# Patient Record
Sex: Male | Born: 1950 | Race: White | Hispanic: No | Marital: Married | Smoking: Never smoker
Health system: Southern US, Community
[De-identification: ages and names within clinical notes are randomized; demographics above are authoritative.]

## PROBLEM LIST (undated history)

## (undated) DIAGNOSIS — J45909 Unspecified asthma, uncomplicated: Secondary | ICD-10-CM

## (undated) DIAGNOSIS — J189 Pneumonia, unspecified organism: Secondary | ICD-10-CM

## (undated) DIAGNOSIS — Z87442 Personal history of urinary calculi: Secondary | ICD-10-CM

## (undated) DIAGNOSIS — M1990 Unspecified osteoarthritis, unspecified site: Secondary | ICD-10-CM

## (undated) DIAGNOSIS — I10 Essential (primary) hypertension: Secondary | ICD-10-CM

## (undated) HISTORY — PX: BACK SURGERY: SHX140

## (undated) HISTORY — PX: CARPAL TUNNEL RELEASE: SHX101

## (undated) HISTORY — PX: SINUS SURGERY WITH INSTATRAK: SHX5215

## (undated) HISTORY — PX: JOINT REPLACEMENT: SHX530

## (undated) HISTORY — PX: COLONOSCOPY: SHX5424

## (undated) HISTORY — PX: SHOULDER ARTHROSCOPY: SHX128

---

## 2010-06-07 ENCOUNTER — Encounter (HOSPITAL_COMMUNITY)
Admission: RE | Admit: 2010-06-07 | Discharge: 2010-06-07 | Disposition: A | Discharge status: Home / Self Care | Payer: Managed Care, Other (non HMO) | Source: Ambulatory Visit | Attending: Orthopedic Surgery | Admitting: Orthopedic Surgery

## 2010-06-07 DIAGNOSIS — Z01812 Encounter for preprocedural laboratory examination: Secondary | ICD-10-CM | POA: Insufficient documentation

## 2010-06-07 DIAGNOSIS — M19019 Primary osteoarthritis, unspecified shoulder: Secondary | ICD-10-CM | POA: Insufficient documentation

## 2010-06-07 DIAGNOSIS — Z01818 Encounter for other preprocedural examination: Secondary | ICD-10-CM | POA: Insufficient documentation

## 2010-06-07 LAB — CBC
MCV: 91.2 fL (ref 78.0–100.0)
Platelets: 218 10*3/uL (ref 150–400)
RBC: 4.77 MIL/uL (ref 4.22–5.81)
WBC: 7.4 10*3/uL (ref 4.0–10.5)

## 2010-06-07 LAB — COMPREHENSIVE METABOLIC PANEL
Albumin: 4.5 g/dL (ref 3.5–5.2)
BUN: 14 mg/dL (ref 6–23)
Calcium: 9.6 mg/dL (ref 8.4–10.5)
Creatinine, Ser: 0.92 mg/dL (ref 0.4–1.5)
Glucose, Bld: 77 mg/dL (ref 70–99)
Total Protein: 7 g/dL (ref 6.0–8.3)

## 2010-06-07 LAB — PROTIME-INR
INR: 0.93 (ref 0.00–1.49)
Prothrombin Time: 12.7 seconds (ref 11.6–15.2)

## 2010-06-07 LAB — DIFFERENTIAL
Basophils Absolute: 0 10*3/uL (ref 0.0–0.1)
Eosinophils Absolute: 0.2 10*3/uL (ref 0.0–0.7)
Lymphs Abs: 2.4 10*3/uL (ref 0.7–4.0)
Neutrophils Relative %: 54 % (ref 43–77)

## 2010-06-07 LAB — URINALYSIS, ROUTINE W REFLEX MICROSCOPIC
Bilirubin Urine: NEGATIVE
Nitrite: NEGATIVE
Specific Gravity, Urine: 1.028 (ref 1.005–1.030)
Urobilinogen, UA: 0.2 mg/dL (ref 0.0–1.0)
pH: 5.5 (ref 5.0–8.0)

## 2010-06-07 LAB — SURGICAL PCR SCREEN
MRSA, PCR: NEGATIVE
Staphylococcus aureus: NEGATIVE

## 2010-06-07 LAB — APTT: aPTT: 27 seconds (ref 24–37)

## 2010-06-13 ENCOUNTER — Observation Stay (HOSPITAL_COMMUNITY)
Admission: RE | Admit: 2010-06-13 | Discharge: 2010-06-14 | Disposition: A | Discharge status: Home / Self Care | Payer: Managed Care, Other (non HMO) | Source: Ambulatory Visit | Attending: Orthopedic Surgery | Admitting: Orthopedic Surgery

## 2010-06-13 DIAGNOSIS — J45909 Unspecified asthma, uncomplicated: Secondary | ICD-10-CM | POA: Insufficient documentation

## 2010-06-13 DIAGNOSIS — M19019 Primary osteoarthritis, unspecified shoulder: Principal | ICD-10-CM | POA: Insufficient documentation

## 2010-07-08 NOTE — Op Note (Signed)
Glenn Mcdonald, Glenn Mcdonald              ACCOUNT NO.:  [id number]  MEDICAL RECORD NO.:  [id number]           PATIENT TYPE:  I  LOCATION:  5002                         FACILITY:  [facility]  PHYSICIAN:  Glenn Mcdonald, M.D.  DATE OF BIRTH:  04-09-1950  DATE OF PROCEDURE:  06/13/2010 DATE OF DISCHARGE:                              OPERATIVE REPORT   PREOPERATIVE DIAGNOSIS:  End-stage right shoulder glenohumeral joint osteoarthrosis.  POSTOPERATIVE DIAGNOSIS:  End-stage right shoulder glenohumeral joint osteoarthrosis.  PROCEDURE:  Right total shoulder arthroplasty utilizing a size 12 Press- Fit DePuy global stem with a 52 x 18 humeral head, and a cemented pegged 48 glenoid.  SURGEON:  Glenn Rea. Aidden Markovic, MD  ASSISTANT:  Glenn Lora. Shuford, PA-C.  ANESTHESIA:  General endotracheal as well as an interscalene block.  ESTIMATED BLOOD LOSS:  100 mL.  DRAINS:  None.  HISTORY:  Glenn Mcdonald is a 60 year old gentleman who has had progressive increasing bilateral shoulder pain, right more severe than left with radiographs showing end-stage arthrosis right more severe than left with bone-on-bone contact, and large peripheral osteophyte formation.  Due to his ongoing pain and functional limitations, he is brought to the operating room at this time for planned right total shoulder arthroplasty.  We preoperatively counseled Glenn Mcdonald on treatment options as well as risks versus benefits thereof.  Possible surgical complications were reviewed including the potential for bleeding, infection, neurovascular injury, persistent pain, failure of the implant, anesthetic complication and possible need for additional surgery.  He understands and accepts and agrees with our planned procedure.  PROCEDURE IN DETAIL:  After undergoing routine preop evaluation, the patient received prophylactic antibiotics, and an interscalene block was established in the holding area by the Anesthesia Department.   Placed supine on the operating table and underwent smooth induction of general endotracheal anesthesia.  Placed into beach-chair position and appropriately padded and protected.  Right shoulder examination showed marked restrictions in mobility with severe crepitance.  Right shoulder girdle region was then sterilely prepped and draped standard fashion. Time-out was called.  An anterior approach to the right shoulder was made through the deltopectoral interval through an approximately 15-cm incision.  Skin flaps were elevated.  Electrocautery was used for hemostasis.  The cephalic vein was identified, retracted laterally with the deltoid, and the interval was then developed bluntly.  Upper margin of this pectoralis major tendon was tenotomized to improve visualization.  Self-retaining retractors were placed, and the adhesions in the subacromial/subdeltoid bursa were released.  The conjoined joint tendon was mobilized and retracted medially.  The anterior humeral circumflex vessels were identified and electrocoagulated.  Biceps tendon sheath was unroofed, biceps tendon was tenotomized for later tenodesis. The subscapularis was then divided from the lesser tuberosity leaving a 1-cm cuff of tissue for later repair.  The free margin of the subscap was then tagged with a series of grasping #2 FiberWire sutures.  The rotator interval was then split to the base of the coracoid, and subscap was retracted medially.  Humeral head was then delivered through the wound and externally rotated as the capsular attachments were released from the anterior, inferior, and posterior  margins of the humeral head allowing complete dislocation of the humeral head, and protected the rotator cuff superiorly and posteriorly.  Very prominent osteophyte was noted inferiorly.  Using an extramedullary guide, we outlined our proposed humeral head resection, and this was then performed with approximately 30 degrees  retroversion using an oscillating saw.  We then removed the peripheral osteophytes.  Capsular releases were was then completed.  With care taken to protect the rotator cuff, we the performed hand reaming opening up the humeral canal to a size 12.  The box cutting guide was then placed and had 30 degrees retroversion, it was driven into place.  We then performed broaching up to size 12 with excellent fit.  The broach was removed and metal cap placed on the cut surface of the proximal humerus.  We then turned our attention to the glenoid, which was exposed with a combination of pitchfork and snake tongue retractors as well as the [place].  Performed a circumferential labral resection and capsular release.  Once we had complete exposure of the glenoid, we then sized and the 48 showed the best coverage and fit. The central guidepin was placed and we reamed with a size 48 reamer cracking a bit of the retroversion of the glenoid.  The central peg hole was then drilled and then using proper guide, the peripheral peg holes were then drilled.  We then performed a trial, which showed excellent fit.  At this point, the glenoid was then irrigated, dried, cement wasmixed, introduced into the peripheral peg holes, and then final 48 glenoid was then impacted into position showing excellent fit.  Once the cement had hardened, we then turned our attention back to the proximal humerus where the final size 12 stem was impacted into position.  We did use bone graft harvested from the resected humeral head to bone graft about the proximal metaphyseal portion of the implant and again, excellent fixation there was achieved.  We then performed trial reductions and the 52 x 18 head showed the best coverage with excellent soft tissue balance and approximately 50% translation of the humeral head posteriorly.  We performed final impaction of the 52 x 18 head and a final reduction was performed.  Overall, position  alignment was much to my satisfaction.  The subscapularis was then repaired to the lesser tuberosity with the #2 FiberWire sutures, and then rotator interval was closed in addition with 2 cm using the figure-of-eight #2 FiberWire sutures.  Shoulder was taken through a range of motion showing excellent soft tissue balance, good mobility, and good stability.  Final irrigation was completed.  Hemostasis was obtained.  The deltopectoral interval was then reapproximated with 0 Vicryl with a tag of #2 FiberWire at the midpoint.  2-0 Vicryl was used for subcu layer and intracuticular 3-0 Monocryl for the skin followed by Steri-Strips.  Dry dressing was then applied, and the right arm placed in a sling immobilizer.  The patient was awakened, extubated, and taken to the recovery room in stable condition.     Glenn Rea. Mendell Bontempo, M.D.     KMS/MEDQ  D:  06/13/2010  T:  06/14/2010  Job:  [id number]  Electronically Signed by Francena Hanly M.D. on 07/08/2010 12:52:34 PM

## 2010-12-02 ENCOUNTER — Encounter (HOSPITAL_COMMUNITY)
Admission: RE | Admit: 2010-12-02 | Discharge: 2010-12-02 | Disposition: A | Discharge status: Home / Self Care | Payer: Managed Care, Other (non HMO) | Source: Ambulatory Visit | Attending: Orthopedic Surgery | Admitting: Orthopedic Surgery

## 2010-12-02 LAB — URINALYSIS, ROUTINE W REFLEX MICROSCOPIC
Bilirubin Urine: NEGATIVE
Leukocytes, UA: NEGATIVE
Nitrite: NEGATIVE
Specific Gravity, Urine: 1.019 (ref 1.005–1.030)
Urobilinogen, UA: 0.2 mg/dL (ref 0.0–1.0)
pH: 5 (ref 5.0–8.0)

## 2010-12-02 LAB — COMPREHENSIVE METABOLIC PANEL
ALT: 31 U/L (ref 0–53)
AST: 21 U/L (ref 0–37)
Alkaline Phosphatase: 75 U/L (ref 39–117)
CO2: 31 mEq/L (ref 19–32)
Calcium: 10 mg/dL (ref 8.4–10.5)
Glucose, Bld: 79 mg/dL (ref 70–99)
Potassium: 5.1 mEq/L (ref 3.5–5.1)
Sodium: 140 mEq/L (ref 135–145)
Total Protein: 7.5 g/dL (ref 6.0–8.3)

## 2010-12-02 LAB — SURGICAL PCR SCREEN
MRSA, PCR: NEGATIVE
Staphylococcus aureus: NEGATIVE

## 2010-12-02 LAB — CBC
Hemoglobin: 15.6 g/dL (ref 13.0–17.0)
MCH: 32 pg (ref 26.0–34.0)
MCHC: 35.1 g/dL (ref 30.0–36.0)
Platelets: 202 10*3/uL (ref 150–400)
RBC: 4.88 MIL/uL (ref 4.22–5.81)

## 2010-12-02 LAB — APTT: aPTT: 28 seconds (ref 24–37)

## 2010-12-12 ENCOUNTER — Ambulatory Visit (HOSPITAL_COMMUNITY)
Admission: RE | Admit: 2010-12-12 | Discharge: 2010-12-13 | DRG: 484 | Disposition: A | Discharge status: Home / Self Care | Payer: Managed Care, Other (non HMO) | Source: Ambulatory Visit | Attending: Orthopedic Surgery | Admitting: Orthopedic Surgery

## 2010-12-12 DIAGNOSIS — Z01812 Encounter for preprocedural laboratory examination: Secondary | ICD-10-CM | POA: Insufficient documentation

## 2010-12-12 DIAGNOSIS — J45909 Unspecified asthma, uncomplicated: Secondary | ICD-10-CM | POA: Insufficient documentation

## 2010-12-12 DIAGNOSIS — M19019 Primary osteoarthritis, unspecified shoulder: Secondary | ICD-10-CM | POA: Insufficient documentation

## 2011-01-14 NOTE — Op Note (Signed)
NAMEAEDYN, Glenn Mcdonald              ACCOUNT NO.:  [id number]  MEDICAL RECORD NO.:  [id number]  LOCATION:  [facility]                         FACILITY:  [facility]  PHYSICIAN:  Vania Rea. Jacqueline Delapena, M.D.  DATE OF BIRTH:  13-Apr-1950  DATE OF PROCEDURE:  12/12/2010 DATE OF DISCHARGE:                              OPERATIVE REPORT   PREOPERATIVE DIAGNOSIS:  End-stage left shoulder osteoarthrosis.  POSTOPERATIVE DIAGNOSIS:  End-stage left shoulder osteoarthrosis.  PROCEDURE:  Left total shoulder arthroplasty utilizing a Press-Fit size 10 DePuy global stem with a 52 x 18 eccentric humeral head and a cemented pegged 48 glenoid.  SURGEON:  Vania Rea. Harding Thomure, MD  ASSISTANT:  Lucita Lora. Shuford, PA-C  ANESTHESIA:  General endotracheal as well as an interscalene block.  BLOOD LOSS:  250 mL.  DRAINS:  None.  HISTORY:  Glenn Mcdonald is a 60 year old gentleman who has had chronic and progressive increasing left shoulder pain as well as functional limitations secondary to end-stage osteoarthrosis.  I had performed a right total shoulder arthroplasty previously which he has done very well with and now returns for planned left total shoulder arthroplasty.  Preoperatively, we counseled Glenn Mcdonald on treatment options as well as risks versus benefits thereof.  Possible surgical complications were reviewed including potential for bleeding, infection, neurovascular injury, persistent pain, loss of motion, failure of implant, anesthetic complication, and possible need for additional surgery.  They understand and accept and agree with our planned procedure.  PROCEDURE IN DETAIL:  After undergoing routine preop evaluation, the patient received prophylactic antibiotics and an interscalene block was established in holding area by the Anesthesia Department.  He was placed supine on the operating table and underwent smooth induction of general endotracheal anesthesia.  He was placed into beach-chair position  and appropriately padded and protected.  Left shoulder girdle region was sterilely prepped and draped in a standard fashion.  A time-out was called.  An anterior approach to the left shoulder was made through an approximately 15-cm incision along the deltopectoral interval.  Skin flaps were elevated and electrocautery was used for hemostasis.  The cephalic vein was identified and retracted laterally with the deltoid. The deltopectoral interval was then developed bluntly and the upper centimeter of the pec major was tenotomized for enhance visualization. The conjoined tendon was then identified, mobilized, and retracted medially.  We then identified the biceps tendon, unroofed bicipital groove, and tenotomized the biceps.  We then split the rotator interval towards the base of the coracoid and then tenotomized the subscapularis leaving a cuff approximately 2 cm in length of tissue at the lesser tuberosity for later repair and then tagged the free margin of the subscapularis with a series of #2 FiberWire sutures.  The subscapularis was then circumferentially mobilized and retracted medially.  This allowed [place] to deliver the humeral head through the wound.  Using an extramedullary guide, we then outlined our proposed humeral head resection and this was performed with an oscillating saw at approximately 30-degree retroversion angle.  We then removed the peripheral osteophytes, which were very large anteriorly.  A series of hand reaming was then used to gain access to the humoral medullary canal and we reamed up to size 10  which had a very tight fit distally.  We then used the cookie cutter broach to establish the position of the implant at approximately 30 degrees of retroversion.  We broached him and we then performed up to size 10 with good fit.  Once this was completed, we used a metal cap to protect the cut surface of the proximal humerus and then used retractors to expose the glenoid  with [place] posteriorly pitchfork and snake tongue anteriorly and superiorly and inferiorly as necessary.  We then performed a circumferential release of the labrum and capsular tissues and it should also mention that we divided capsular tissues away from the anterior, inferior, and posterior aspects of the humeral head to enhance visualization.  Once we had completely removed all peripheral labral tissue from the glenoid, it was felt that there was a mild degree of retroversion.  The 48 glenoid had the best fit and the central guidepin was then placed and we did correct for the retroversion and reamed with a 48 to a subchondral bony bed.  All residual bony debris was removed with rongeur.  Central drill hole was then placed and then using the peripheral drill guide we drilled the peripheral peg holes.  The trial 48 showed excellent fit. At this point, the glenoid was then irrigated.  Cement was mixed in the appropriate consistency.  We introduced cement into the peripheral peg holes and the final 48 glenoid was tamped into position with excellent fixation and fit.  Cement was allowed to harden.  At this point, we then returned our attention to the proximal humerus where we irrigated the canal and then implanted the size 10 stem after we utilized bone graft harvested from the resected humeral head to pack around the metaphyseal segment of the stem and then performed final implantation, obtained excellent fixation and fit.  We then performed trial reductions and the 52 x 18 eccentric provided the best coverage of the proximal humerus and good soft tissue balance with 50% translation of the humeral head on the glenoid.  The final 58 x 18 eccentric was then placed into final position, impacted after the [facility] taper was meticulously cleaned. Final reduction was performed.  At this point, we then repaired the subscapularis to the soft tissue tag utilizing a series of #2 FiberWire sutures,  obtaining excellent fixation fit and good soft tissue balance and we again confirmed the subscapularis had been properly mobilized and once this was completed we easily achieved 30 degrees of external rotation.  We also closed the lateral 2 cm of the rotator interval with figure-of-eight #2 FiberWire sutures.  A biceps tenodesis was then performed also utilizing the fiber wires.  The wound was then copiously irrigated.  Hemostasis was obtained.  The deltopectoral interval was then reapproximated with a series of 0 Vicryl sutures.  2-0 Vicryl was used for the subcu layer and intracuticular 3-0 Monocryl for the skin followed by Steri-Strips.  Dry dressing was then applied and the left arm was placed in a sling.  The patient was awakened, extubated, and taken to the recovery room in stable condition.     Vania Rea. Osa Fogarty, M.D.     KMS/MEDQ  D:  12/12/2010  T:  12/12/2010  Job:  [id number]  Electronically Signed by Francena Hanly M.D. on 01/14/2011 06:43:33 PM

## 2011-01-14 NOTE — Op Note (Signed)
  NAMETRAYSHAWN, DURKIN              ACCOUNT NO.:  [id number]  MEDICAL RECORD NO.:  [id number]  LOCATION:  [facility]                         FACILITY:  [facility]  PHYSICIAN:  Vania Rea. Eustacia Urbanek, M.D.  DATE OF BIRTH:  1950-08-08  DATE OF PROCEDURE:  12/12/2010 DATE OF DISCHARGE:                              OPERATIVE REPORT   ADDENDUM:  Ralene Bathe, PA-C was a surgical assistant throughout this case essential for positioning of the extremity, holding retractors, exposure, and intraoperative clinical decision making.     Vania Rea. Yarelin Reichardt, M.D.     KMS/MEDQ  D:  12/12/2010  T:  12/12/2010  Job:  [id number]  Electronically Signed by Francena Hanly M.D. on 01/14/2011 06:43:35 PM

## 2017-08-17 ENCOUNTER — Other Ambulatory Visit: Payer: Self-pay | Admitting: Specialist

## 2017-08-17 DIAGNOSIS — M5442 Lumbago with sciatica, left side: Secondary | ICD-10-CM

## 2017-08-24 ENCOUNTER — Ambulatory Visit
Admission: RE | Admit: 2017-08-24 | Discharge: 2017-08-24 | Disposition: A | Discharge status: Home / Self Care | Payer: Self-pay | Source: Ambulatory Visit | Attending: Specialist | Admitting: Specialist

## 2017-08-24 ENCOUNTER — Other Ambulatory Visit: Payer: Self-pay | Admitting: Specialist

## 2017-08-24 DIAGNOSIS — M5442 Lumbago with sciatica, left side: Secondary | ICD-10-CM

## 2017-08-25 ENCOUNTER — Ambulatory Visit
Admission: RE | Admit: 2017-08-25 | Discharge: 2017-08-25 | Disposition: A | Discharge status: Home / Self Care | Payer: Medicare Other | Source: Ambulatory Visit | Attending: Specialist | Admitting: Specialist

## 2017-08-25 DIAGNOSIS — M5442 Lumbago with sciatica, left side: Secondary | ICD-10-CM

## 2017-08-25 MED ORDER — ONDANSETRON HCL 4 MG/2ML IJ SOLN: 4.0000 mg | Freq: Four times a day (QID) | INTRAMUSCULAR | Status: DC | PRN

## 2017-08-25 MED ORDER — DIAZEPAM 5 MG PO TABS
5.0000 mg | ORAL_TABLET | Freq: Once | ORAL | Status: AC
  Administered 2017-08-25: 5 mg via ORAL

## 2017-08-25 MED ORDER — IOPAMIDOL (ISOVUE-M 200) INJECTION 41%
15.0000 mL | Freq: Once | INTRAMUSCULAR | Status: AC
  Administered 2017-08-25: 15 mL via INTRATHECAL

## 2017-08-25 NOTE — Discharge Instructions (Signed)

## 2017-09-03 ENCOUNTER — Ambulatory Visit: Payer: Self-pay | Admitting: Orthopedic Surgery

## 2017-09-08 ENCOUNTER — Ambulatory Visit: Payer: Self-pay | Admitting: Orthopedic Surgery

## 2017-09-08 NOTE — H&P (Signed)
Glenn Mcdonald is an 67 y.o. male.   Chief Complaint: back and L leg pain HPI: Reason for Visit: review of test results (CT/Myelogram); The patient is 7 1/2 months out from when symptoms began..  Location (Lower Extremity): lower back pain on the left; leg pain on the left, foot pain on the left, Severity: pain level 2/10  Aggravating Factors: standing for 10 min  Associated Symptoms: numbness/tingling; weakness (left lower extremity)  Medications: The patient is not taking any medication for his back, but is taking Hydrocodone for a kidney stone.  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 Codeine Nausea Only   Medications: amLODIPine 5 mg tablet aspirin ciprofloxacin 500 mg tablet DOK 100 mg capsule TK 2 CS PO QD HYDROcodone 7.5 mg-acetaminophen 325 mg tabl rosuvastatin 20 mg tablet tamsulosin 0.4 mg capsule  Review of Systems  Constitutional: Negative.   HENT: Negative.   Eyes: Negative.   Respiratory: Negative.   Cardiovascular: Negative.   Gastrointestinal: Negative.   Genitourinary: Negative.   Musculoskeletal: Positive for back pain and joint pain.  Skin: Negative.   Neurological: Positive for sensory change and focal weakness.  Psychiatric/Behavioral: Negative.     There were no vitals taken for this visit. Physical Exam  Constitutional: He is oriented to person, place, and time. He appears well-developed and well-nourished. He appears distressed.  HENT:  Head: Normocephalic.  Eyes: Pupils are equal, round, and reactive to light.  Neck: Normal range of motion.   Cardiovascular: Normal rate.  Respiratory: Effort normal.  GI: Soft.  Musculoskeletal:  Gait and Station: Appearance: ambulating with no assistive devices and antalgic gait.  Constitutional: General Appearance: healthy-appearing and distress (mild).  Psychiatric: Mood and Affect: active and alert.  Cardiovascular System: Edema Right: none; Dorsalis and posterior tibial pulses 2+. Edema Left: none.  Abdomen: Inspection and Palpation: non-distended and no tenderness.  Skin: Inspection and palpation: no rash.  Lumbar Spine: Inspection: normal alignment. Bony Palpation of the Lumbar Spine: tender at lumbosacral junction.. Bony Palpation of the Right Hip: no tenderness of the greater trochanter and tenderness of the SI joint; Pelvis stable. Bony Palpation of the Left Hip: no tenderness of the greater trochanter and tenderness of the SI joint. Soft Tissue Palpation on the Right: No flank pain with percussion. Active Range of Motion: limited flexion and extention.  Motor Strength: L1 Motor Strength on the Right: hip flexion iliopsoas 5/5. L1 Motor Strength on the Left: hip flexion iliopsoas 5/5. L2-L4 Motor Strength on the Right: knee extension quadriceps 5/5. L2-L4 Motor Strength on the Left: knee extension quadriceps 5/5. L5 Motor Strength on the Right: ankle dorsiflexion tibialis anterior 5/5 and great toe extension extensor hallucis longus 5/5. L5 Motor Strength on the Left: ankle dorsiflexion tibialis anterior 5/5 and great toe extension extensor hallucis longus 5/5. S1 Motor Strength on the Right: plantar flexion gastrocnemius 5/5. S1 Motor Strength on the Left: plantar flexion gastrocnemius 5/5.  Neurological System: Knee Reflex Right: normal (2). Knee Reflex Left: normal (2). Ankle Reflex Right: normal (2). Ankle Reflex Left: normal (2). Babinski Reflex Right: plantar reflex absent. Babinski Reflex Left: plantar reflex absent. Sensation on the Right: normal distal extremities. Sensation   on the  Left: normal distal extremities. Special Tests on the Right: no clonus of the ankle/knee and seated straight leg raising test positive. Special Tests on the Left: no clonus of the ankle/knee and seated straight leg raising test positive.  Straight leg raise a left buttock pain calf pain negative on the right EHLs 4+/5 left compared to the right. Altered sensation L5-S1 dermatome.  Neurological: He is alert and oriented to person, place, and time.  Skin: Skin is warm and dry.     Assessment/Plan CT myelogram demonstrates of stenosis at the L4-5 and L5-S1 spaces. History of lumbar decompression L5-S1. MRI demonstrates disc bulge and extrusion with L5 nerve root compression at L4-5. Lateral recess stenosis at L5-S1. This is the second to the last open disc space.  Patient demonstrates refractory L5-S1 radiculopathy. History of lumbar decompression at 5 1 apparently. Patient has a transitional vertebrae.  We discussed options including living with his symptoms injections pain management versus consideration of microlumbar decompression. He would like to proceed with the decompression.  I recommend a central decompression with removal of the spinous process of 5 and the neural arch of 5 to decompression L4-5 and L5-S1.  I had an extensive discussion with the patient concerning the pathology relevant anatomy and treatment options. At this point exhausting conservative treatment and in the presence of a neurologic deficit we discussed microlumbar decompression. I discussed the risks and benefits including bleeding, infection, DVT, PE, anesthetic complications, worsening in their symptoms, improvement in their symptoms, C SF leakage, epidural fibrosis, need for future surgeries such as revision discectomy and lumbar fusion. I also indicated that this is an operation to basically decompress the nerve root to allow recovery as opposed to fixing a herniated disc and that the incidence of recurrent chest disc  herniation can approach 15%. Also that nerve root recovery is variable and may not recover completely.  I discussed the operative course including overnight in the hospital. Immediate ambulation. Follow-up in 2 weeks for suture removal. 6 weeks until healing of the herniation followed by 6 weeks of reconditioning and strengthening of the core musculature. Also discussed the need to employ the concepts of disc pressure management and core motion following the surgery to minimize the risk of recurrent disc herniation. We will obtain preoperative clearance i if necessary and proceed accordingly.  Also discussed possibility requiring fusion in the future progressive distant degeneration I am making no surgery in the future possible. And with a revision a higher degree of CSF leakage.  Plan revision microlumbar decompression L4-5, L5-S1   Donathan Buller M., PA-C  For Dr. Beane 09/08/2017, 8:50 AM   

## 2017-09-08 NOTE — H&P (View-Only) (Signed)
Glenn Mcdonald is an 67 y.o. male.   Chief Complaint: back and L leg pain HPI: Reason for Visit: review of test results (CT/Myelogram); The patient is 7 1/2 months out from when symptoms began..  Location (Lower Extremity): lower back pain on the left; leg pain on the left, foot pain on the left, Severity: pain level 2/10  Aggravating Factors: standing for 10 min  Associated Symptoms: numbness/tingling; weakness (left lower extremity)  Medications: The patient is not taking any medication for his back, but is taking Hydrocodone for a kidney stone.  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 Codeine Nausea Only   Medications: amLODIPine 5 mg tablet aspirin ciprofloxacin 500 mg tablet DOK 100 mg capsule TK 2 CS PO QD HYDROcodone 7.5 mg-acetaminophen 325 mg tabl rosuvastatin 20 mg tablet tamsulosin 0.4 mg capsule  Review of Systems  Constitutional: Negative.   HENT: Negative.   Eyes: Negative.   Respiratory: Negative.   Cardiovascular: Negative.   Gastrointestinal: Negative.   Genitourinary: Negative.   Musculoskeletal: Positive for back pain and joint pain.  Skin: Negative.   Neurological: Positive for sensory change and focal weakness.  Psychiatric/Behavioral: Negative.     There were no vitals taken for this visit. Physical Exam  Constitutional: He is oriented to person, place, and time. He appears well-developed and well-nourished. He appears distressed.  HENT:  Head: Normocephalic.  Eyes: Pupils are equal, round, and reactive to light.  Neck: Normal range of motion.   Cardiovascular: Normal rate.  Respiratory: Effort normal.  GI: Soft.  Musculoskeletal:  Gait and Station: Appearance: ambulating with no assistive devices and antalgic gait.  Constitutional: General Appearance: healthy-appearing and distress (mild).  Psychiatric: Mood and Affect: active and alert.  Cardiovascular System: Edema Right: none; Dorsalis and posterior tibial pulses 2+. Edema Left: none.  Abdomen: Inspection and Palpation: non-distended and no tenderness.  Skin: Inspection and palpation: no rash.  Lumbar Spine: Inspection: normal alignment. Bony Palpation of the Lumbar Spine: tender at lumbosacral junction.. Bony Palpation of the Right Hip: no tenderness of the greater trochanter and tenderness of the SI joint; Pelvis stable. Bony Palpation of the Left Hip: no tenderness of the greater trochanter and tenderness of the SI joint. Soft Tissue Palpation on the Right: No flank pain with percussion. Active Range of Motion: limited flexion and extention.  Motor Strength: L1 Motor Strength on the Right: hip flexion iliopsoas 5/5. L1 Motor Strength on the Left: hip flexion iliopsoas 5/5. L2-L4 Motor Strength on the Right: knee extension quadriceps 5/5. L2-L4 Motor Strength on the Left: knee extension quadriceps 5/5. L5 Motor Strength on the Right: ankle dorsiflexion tibialis anterior 5/5 and great toe extension extensor hallucis longus 5/5. L5 Motor Strength on the Left: ankle dorsiflexion tibialis anterior 5/5 and great toe extension extensor hallucis longus 5/5. S1 Motor Strength on the Right: plantar flexion gastrocnemius 5/5. S1 Motor Strength on the Left: plantar flexion gastrocnemius 5/5.  Neurological System: Knee Reflex Right: normal (2). Knee Reflex Left: normal (2). Ankle Reflex Right: normal (2). Ankle Reflex Left: normal (2). Babinski Reflex Right: plantar reflex absent. Babinski Reflex Left: plantar reflex absent. Sensation on the Right: normal distal extremities. Sensation  on the  Left: normal distal extremities. Special Tests on the Right: no clonus of the ankle/knee and seated straight leg raising test positive. Special Tests on the Left: no clonus of the ankle/knee and seated straight leg raising test positive.  Straight leg raise a left buttock pain calf pain negative on the right EHLs 4+/5 left compared to the right. Altered sensation L5-S1 dermatome.  Neurological: He is alert and oriented to person, place, and time.  Skin: Skin is warm and dry.     Assessment/Plan CT myelogram demonstrates of stenosis at the L4-5 and L5-S1 spaces. History of lumbar decompression L5-S1. MRI demonstrates disc bulge and extrusion with L5 nerve root compression at L4-5. Lateral recess stenosis at L5-S1. This is the second to the last open disc space.  Patient demonstrates refractory L5-S1 radiculopathy. History of lumbar decompression at 5 1 apparently. Patient has a transitional vertebrae.  We discussed options including living with his symptoms injections pain management versus consideration of microlumbar decompression. He would like to proceed with the decompression.  I recommend a central decompression with removal of the spinous process of 5 and the neural arch of 5 to decompression L4-5 and L5-S1.  I had an extensive discussion with the patient concerning the pathology relevant anatomy and treatment options. At this point exhausting conservative treatment and in the presence of a neurologic deficit we discussed microlumbar decompression. I discussed the risks and benefits including bleeding, infection, DVT, PE, anesthetic complications, worsening in their symptoms, improvement in their symptoms, C SF leakage, epidural fibrosis, need for future surgeries such as revision discectomy and lumbar fusion. I also indicated that this is an operation to basically decompress the nerve root to allow recovery as opposed to fixing a herniated disc and that the incidence of recurrent chest disc  herniation can approach 15%. Also that nerve root recovery is variable and may not recover completely.  I discussed the operative course including overnight in the hospital. Immediate ambulation. Follow-up in 2 weeks for suture removal. 6 weeks until healing of the herniation followed by 6 weeks of reconditioning and strengthening of the core musculature. Also discussed the need to employ the concepts of disc pressure management and core motion following the surgery to minimize the risk of recurrent disc herniation. We will obtain preoperative clearance i if necessary and proceed accordingly.  Also discussed possibility requiring fusion in the future progressive distant degeneration I am making no surgery in the future possible. And with a revision a higher degree of CSF leakage.  Plan revision microlumbar decompression L4-5, L5-S1   BISSELL, Dayna Barker., PA-C  For Dr. Shelle Iron 09/08/2017, 8:50 AM

## 2017-09-11 NOTE — Pre-Procedure Instructions (Signed)
Glenn Mcdonald  09/11/2017      [id number] [place] - [address] [facility] [address] [place] [id number] Phone: [phone] Fax: [phone]    Your procedure is scheduled on June 13  [facility] Ad[facility] at 530 A.M.  Call this number if you have problems the morning of surgery:  [phone]   Remember:  No food  Or drink after midnight.     Take these medicines the morning of surgery with A SIP OF WATER Tamsulosin (Flomax)  Stop taking aspirin, BC's, Goody's, Herbal medications, Fish Oil, Aleve, Ibuprofen, Advil, Motrin    Do not wear jewelry, make-up or nail polish.  Do not wear lotions, powders, or perfumes, or deodorant.  Do not shave 48 hours prior to surgery.  Men may shave face and neck.  Do not bring valuables to the hospital.  [facility] is not responsible for any belongings or valuables.  Contacts, dentures or bridgework may not be worn into surgery.  Leave your suitcase in the car.  After surgery it may be brought to your room.  For patients admitted to the hospital, discharge time will be determined by your treatment team.  Patients discharged the day of surgery will not be allowed to drive home.    Special instructions:   [place]- Preparing For Surgery  Before surgery, you can play an important role. Because skin is not sterile, your skin needs to be as free of germs as possible. You can reduce the number of germs on your skin by washing with CHG (chlorahexidine gluconate) Soap before surgery.  CHG is an antiseptic cleaner which kills germs and bonds with the skin to continue killing germs even after washing.    Oral Hygiene is also important to reduce your risk of infection.  Remember - BRUSH YOUR TEETH THE MORNING OF SURGERY WITH YOUR REGULAR TOOTHPASTE  Please do not use if you have an allergy to CHG or antibacterial soaps. If your skin becomes reddened/irritated stop using the  CHG.  Do not shave (including legs and underarms) for at least 48 hours prior to first CHG shower. It is OK to shave your face.  Please follow these instructions carefully.   1. Shower the NIGHT BEFORE SURGERY and the MORNING OF SURGERY with CHG.   2. If you chose to wash your hair, wash your hair first as usual with your normal shampoo.  3. After you shampoo, rinse your hair and body thoroughly to remove the shampoo.  4. Use CHG as you would any other liquid soap. You can apply CHG directly to the skin and wash gently with a scrungie or a clean washcloth.   5. Apply the CHG Soap to your body ONLY FROM THE NECK DOWN.  Do not use on open wounds or open sores. Avoid contact with your eyes, ears, mouth and genitals (private parts). Wash Face and genitals (private parts)  with your normal soap.  6. Wash thoroughly, paying special attention to the area where your surgery will be performed.  7. Thoroughly rinse your body with warm water from the neck down.  8. DO NOT shower/wash with your normal soap after using and rinsing off the CHG Soap.  9. Pat yourself dry with a CLEAN TOWEL.  10. Wear CLEAN PAJAMAS to bed the night before surgery, wear comfortable clothes the morning of surgery  11. Place CLEAN SHEETS on your bed  the night of your first shower and DO NOT SLEEP WITH PETS.    Day of Surgery:  Do not apply any deodorants/lotions.  Please wear clean clothes to the hospital/surgery center.   Remember to brush your teeth WITH YOUR REGULAR TOOTHPASTE.    Please read over the following fact sheets that you were given. Pain Booklet, Coughing and Deep Breathing, MRSA Information and Surgical Site Infection Prevention

## 2017-09-14 ENCOUNTER — Other Ambulatory Visit: Payer: Self-pay

## 2017-09-14 ENCOUNTER — Encounter (HOSPITAL_COMMUNITY)
Admission: RE | Admit: 2017-09-14 | Discharge: 2017-09-14 | Disposition: A | Discharge status: Home / Self Care | Payer: Medicare Other | Source: Ambulatory Visit | Attending: Specialist | Admitting: Specialist

## 2017-09-14 ENCOUNTER — Encounter (HOSPITAL_COMMUNITY): Payer: Self-pay

## 2017-09-14 ENCOUNTER — Ambulatory Visit (HOSPITAL_COMMUNITY)
Admission: RE | Admit: 2017-09-14 | Discharge: 2017-09-14 | Disposition: A | Discharge status: Home / Self Care | Payer: Medicare Other | Source: Ambulatory Visit | Attending: Orthopedic Surgery | Admitting: Orthopedic Surgery

## 2017-09-14 DIAGNOSIS — M47816 Spondylosis without myelopathy or radiculopathy, lumbar region: Secondary | ICD-10-CM | POA: Diagnosis not present

## 2017-09-14 DIAGNOSIS — M4186 Other forms of scoliosis, lumbar region: Secondary | ICD-10-CM | POA: Insufficient documentation

## 2017-09-14 DIAGNOSIS — Z01818 Encounter for other preprocedural examination: Secondary | ICD-10-CM | POA: Diagnosis not present

## 2017-09-14 DIAGNOSIS — M5126 Other intervertebral disc displacement, lumbar region: Secondary | ICD-10-CM

## 2017-09-14 DIAGNOSIS — M48061 Spinal stenosis, lumbar region without neurogenic claudication: Secondary | ICD-10-CM | POA: Insufficient documentation

## 2017-09-14 HISTORY — DX: Unspecified osteoarthritis, unspecified site: M19.90

## 2017-09-14 HISTORY — DX: Unspecified asthma, uncomplicated: J45.909

## 2017-09-14 HISTORY — DX: Essential (primary) hypertension: I10

## 2017-09-14 HISTORY — DX: Personal history of urinary calculi: Z87.442

## 2017-09-14 LAB — BASIC METABOLIC PANEL
Anion gap: 7 (ref 5–15)
BUN: 11 mg/dL (ref 6–20)
CHLORIDE: 108 mmol/L (ref 101–111)
CO2: 26 mmol/L (ref 22–32)
Calcium: 9.6 mg/dL (ref 8.9–10.3)
Creatinine, Ser: 0.86 mg/dL (ref 0.61–1.24)
GFR calc Af Amer: >60 mL/min (ref >60)
GFR calc non Af Amer: >60 mL/min (ref >60)
Glucose, Bld: 100 mg/dL — ABNORMAL HIGH (ref 65–99)
POTASSIUM: 4.4 mmol/L (ref 3.5–5.1)
SODIUM: 141 mmol/L (ref 135–145)

## 2017-09-14 LAB — CBC
HEMATOCRIT: 44 % (ref 39.0–52.0)
Hemoglobin: 14.7 g/dL (ref 13.0–17.0)
MCH: 32 pg (ref 26.0–34.0)
MCHC: 33.4 g/dL (ref 30.0–36.0)
MCV: 95.7 fL (ref 78.0–100.0)
Platelets: 220 10*3/uL (ref 150–400)
RBC: 4.6 MIL/uL (ref 4.22–5.81)
RDW: 11.4 % — ABNORMAL LOW (ref 11.5–15.5)
WBC: 6.6 10*3/uL (ref 4.0–10.5)

## 2017-09-14 LAB — SURGICAL PCR SCREEN
MRSA, PCR: NEGATIVE
Staphylococcus aureus: NEGATIVE

## 2017-09-14 NOTE — Progress Notes (Signed)
PCP is Dr. Earlie ServerVasivreddy Denies having a cardiologist. Denies chest pain, cough, or fever. Denies ever having a card cath, stress test, or echo.

## 2017-09-16 MED ORDER — DEXTROSE 5 % IV SOLN
3.0000 g | INTRAVENOUS | Status: AC
  Administered 2017-09-17: 3 g via INTRAVENOUS
  Filled 2017-09-16: qty 3

## 2017-09-16 MED ORDER — ACETAMINOPHEN 10 MG/ML IV SOLN
1000.0000 mg | INTRAVENOUS | Status: AC
  Administered 2017-09-17: 1000 mg via INTRAVENOUS
  Filled 2017-09-16: qty 100

## 2017-09-17 ENCOUNTER — Observation Stay (HOSPITAL_COMMUNITY)
Admission: RE | Admit: 2017-09-17 | Discharge: 2017-09-18 | Disposition: A | Discharge status: Home / Self Care | Payer: Medicare Other | Source: Ambulatory Visit | Attending: Specialist | Admitting: Specialist

## 2017-09-17 ENCOUNTER — Ambulatory Visit (HOSPITAL_COMMUNITY): Payer: Medicare Other

## 2017-09-17 ENCOUNTER — Ambulatory Visit (HOSPITAL_COMMUNITY): Payer: Medicare Other | Admitting: Anesthesiology

## 2017-09-17 ENCOUNTER — Encounter (HOSPITAL_COMMUNITY): Payer: Self-pay | Admitting: General Practice

## 2017-09-17 ENCOUNTER — Other Ambulatory Visit: Payer: Self-pay

## 2017-09-17 ENCOUNTER — Encounter (HOSPITAL_COMMUNITY)
Admission: RE | Disposition: A | Discharge status: Home / Self Care | Payer: Self-pay | Source: Ambulatory Visit | Attending: Specialist

## 2017-09-17 DIAGNOSIS — R2689 Other abnormalities of gait and mobility: Secondary | ICD-10-CM | POA: Diagnosis not present

## 2017-09-17 DIAGNOSIS — Z79899 Other long term (current) drug therapy: Secondary | ICD-10-CM | POA: Diagnosis not present

## 2017-09-17 DIAGNOSIS — M48061 Spinal stenosis, lumbar region without neurogenic claudication: Principal | ICD-10-CM | POA: Diagnosis present

## 2017-09-17 DIAGNOSIS — Z419 Encounter for procedure for purposes other than remedying health state, unspecified: Secondary | ICD-10-CM

## 2017-09-17 DIAGNOSIS — M4807 Spinal stenosis, lumbosacral region: Secondary | ICD-10-CM | POA: Insufficient documentation

## 2017-09-17 DIAGNOSIS — E7800 Pure hypercholesterolemia, unspecified: Secondary | ICD-10-CM | POA: Insufficient documentation

## 2017-09-17 DIAGNOSIS — I10 Essential (primary) hypertension: Secondary | ICD-10-CM | POA: Insufficient documentation

## 2017-09-17 DIAGNOSIS — Z7982 Long term (current) use of aspirin: Secondary | ICD-10-CM | POA: Insufficient documentation

## 2017-09-17 HISTORY — PX: LUMBAR LAMINECTOMY/DECOMPRESSION MICRODISCECTOMY: SHX5026

## 2017-09-17 SURGERY — LUMBAR LAMINECTOMY/DECOMPRESSION MICRODISCECTOMY 2 LEVELS
Anesthesia: General | Site: Spine Lumbar

## 2017-09-17 MED ORDER — METHOCARBAMOL 500 MG PO TABS
ORAL_TABLET | ORAL | Status: AC
  Filled 2017-09-17: qty 1

## 2017-09-17 MED ORDER — FENTANYL CITRATE (PF) 100 MCG/2ML IJ SOLN: 25.0000 ug | INTRAMUSCULAR | Status: DC | PRN

## 2017-09-17 MED ORDER — PANTOPRAZOLE SODIUM 20 MG PO TBEC
20.0000 mg | DELAYED_RELEASE_TABLET | Freq: Every day | ORAL | Status: DC
  Administered 2017-09-17: 20 mg via ORAL
  Filled 2017-09-17: qty 1

## 2017-09-17 MED ORDER — LACTATED RINGERS IV SOLN
INTRAVENOUS | Status: DC | PRN
  Administered 2017-09-17 (×2): via INTRAVENOUS

## 2017-09-17 MED ORDER — POLYETHYLENE GLYCOL 3350 17 G PO PACK: 17.0000 g | PACK | Freq: Every day | ORAL | 0 refills | Status: DC

## 2017-09-17 MED ORDER — FENTANYL CITRATE (PF) 250 MCG/5ML IJ SOLN
INTRAMUSCULAR | Status: DC | PRN
  Administered 2017-09-17 (×6): 50 ug via INTRAVENOUS

## 2017-09-17 MED ORDER — OXYCODONE HCL 5 MG PO TABS: 5.0000 mg | ORAL_TABLET | ORAL | 0 refills | Status: AC | PRN

## 2017-09-17 MED ORDER — OXYCODONE HCL 5 MG PO TABS
ORAL_TABLET | ORAL | Status: AC
  Filled 2017-09-17: qty 1

## 2017-09-17 MED ORDER — TAMSULOSIN HCL 0.4 MG PO CAPS
0.4000 mg | ORAL_CAPSULE | Freq: Every day | ORAL | Status: DC
  Administered 2017-09-17: 0.4 mg via ORAL
  Filled 2017-09-17: qty 1

## 2017-09-17 MED ORDER — ACETAMINOPHEN 325 MG PO TABS: 650.0000 mg | ORAL_TABLET | ORAL | Status: DC | PRN

## 2017-09-17 MED ORDER — MIDAZOLAM HCL 5 MG/5ML IJ SOLN
INTRAMUSCULAR | Status: DC | PRN
  Administered 2017-09-17: 2 mg via INTRAVENOUS

## 2017-09-17 MED ORDER — ONDANSETRON HCL 4 MG PO TABS: 4.0000 mg | ORAL_TABLET | Freq: Four times a day (QID) | ORAL | Status: DC | PRN

## 2017-09-17 MED ORDER — FENTANYL CITRATE (PF) 250 MCG/5ML IJ SOLN
INTRAMUSCULAR | Status: AC
  Filled 2017-09-17: qty 5

## 2017-09-17 MED ORDER — LISINOPRIL-HYDROCHLOROTHIAZIDE 10-12.5 MG PO TABS: 1.0000 | ORAL_TABLET | Freq: Every day | ORAL | Status: DC

## 2017-09-17 MED ORDER — HYDROMORPHONE HCL 1 MG/ML IJ SOLN: 1.0000 mg | INTRAMUSCULAR | Status: DC | PRN

## 2017-09-17 MED ORDER — ACETAMINOPHEN 650 MG RE SUPP: 650.0000 mg | RECTAL | Status: DC | PRN

## 2017-09-17 MED ORDER — HYDROCHLOROTHIAZIDE 12.5 MG PO CAPS
12.5000 mg | ORAL_CAPSULE | Freq: Every day | ORAL | Status: DC
  Administered 2017-09-17: 12.5 mg via ORAL
  Filled 2017-09-17: qty 1

## 2017-09-17 MED ORDER — MIDAZOLAM HCL 2 MG/2ML IJ SOLN
INTRAMUSCULAR | Status: AC
  Filled 2017-09-17: qty 2

## 2017-09-17 MED ORDER — MENTHOL 3 MG MT LOZG
1.0000 | LOZENGE | OROMUCOSAL | Status: DC | PRN
  Filled 2017-09-17: qty 9

## 2017-09-17 MED ORDER — ONDANSETRON HCL 4 MG/2ML IJ SOLN: 4.0000 mg | Freq: Four times a day (QID) | INTRAMUSCULAR | Status: DC | PRN

## 2017-09-17 MED ORDER — CEFAZOLIN SODIUM-DEXTROSE 2-4 GM/100ML-% IV SOLN
2.0000 g | Freq: Three times a day (TID) | INTRAVENOUS | Status: AC
  Administered 2017-09-17 – 2017-09-18 (×3): 2 g via INTRAVENOUS
  Filled 2017-09-17 (×3): qty 100

## 2017-09-17 MED ORDER — METHOCARBAMOL 500 MG PO TABS
500.0000 mg | ORAL_TABLET | Freq: Four times a day (QID) | ORAL | Status: DC | PRN
Start: 2017-09-17 — End: 2017-09-18
  Administered 2017-09-17 – 2017-09-18 (×2): 500 mg via ORAL
  Filled 2017-09-17: qty 1

## 2017-09-17 MED ORDER — BUPIVACAINE-EPINEPHRINE (PF) 0.5% -1:200000 IJ SOLN
INTRAMUSCULAR | Status: AC
  Filled 2017-09-17: qty 30

## 2017-09-17 MED ORDER — DOCUSATE SODIUM 100 MG PO CAPS: 200.0000 mg | ORAL_CAPSULE | Freq: Every evening | ORAL | Status: DC

## 2017-09-17 MED ORDER — KCL IN DEXTROSE-NACL 20-5-0.45 MEQ/L-%-% IV SOLN: INTRAVENOUS | Status: DC

## 2017-09-17 MED ORDER — PROPOFOL 10 MG/ML IV BOLUS
INTRAVENOUS | Status: AC
Start: 2017-09-17 — End: ?
  Filled 2017-09-17: qty 20

## 2017-09-17 MED ORDER — RISAQUAD PO CAPS
1.0000 | ORAL_CAPSULE | Freq: Every day | ORAL | Status: DC
  Filled 2017-09-17 (×2): qty 1

## 2017-09-17 MED ORDER — BISACODYL 5 MG PO TBEC: 5.0000 mg | DELAYED_RELEASE_TABLET | Freq: Every day | ORAL | Status: DC | PRN

## 2017-09-17 MED ORDER — PROPOFOL 10 MG/ML IV BOLUS
INTRAVENOUS | Status: DC | PRN
  Administered 2017-09-17: 10 mg via INTRAVENOUS
  Administered 2017-09-17: 30 mg via INTRAVENOUS

## 2017-09-17 MED ORDER — EPHEDRINE SULFATE 50 MG/ML IJ SOLN
INTRAMUSCULAR | Status: DC | PRN
  Administered 2017-09-17 (×3): 5 mg via INTRAVENOUS
  Administered 2017-09-17 (×2): 10 mg via INTRAVENOUS

## 2017-09-17 MED ORDER — SUGAMMADEX SODIUM 500 MG/5ML IV SOLN
INTRAVENOUS | Status: AC
  Filled 2017-09-17: qty 5

## 2017-09-17 MED ORDER — MAGNESIUM CITRATE PO SOLN: 1.0000 | Freq: Once | ORAL | Status: DC | PRN

## 2017-09-17 MED ORDER — DEXAMETHASONE SODIUM PHOSPHATE 10 MG/ML IJ SOLN
INTRAMUSCULAR | Status: DC | PRN
  Administered 2017-09-17: 10 mg via INTRAVENOUS

## 2017-09-17 MED ORDER — ONDANSETRON HCL 4 MG/2ML IJ SOLN
INTRAMUSCULAR | Status: DC | PRN
  Administered 2017-09-17: 4 mg via INTRAVENOUS

## 2017-09-17 MED ORDER — PHENYLEPHRINE 40 MCG/ML (10ML) SYRINGE FOR IV PUSH (FOR BLOOD PRESSURE SUPPORT)
PREFILLED_SYRINGE | INTRAVENOUS | Status: DC | PRN
  Administered 2017-09-17: 40 ug via INTRAVENOUS
  Administered 2017-09-17: 80 ug via INTRAVENOUS
  Administered 2017-09-17 (×3): 40 ug via INTRAVENOUS
  Administered 2017-09-17 (×2): 80 ug via INTRAVENOUS

## 2017-09-17 MED ORDER — OXYCODONE HCL 5 MG PO TABS
10.0000 mg | ORAL_TABLET | ORAL | Status: DC | PRN
  Filled 2017-09-17: qty 2

## 2017-09-17 MED ORDER — THROMBIN 20000 UNITS EX SOLR
CUTANEOUS | Status: DC | PRN
  Administered 2017-09-17: 20 mL via TOPICAL

## 2017-09-17 MED ORDER — DOCUSATE SODIUM 100 MG PO CAPS
100.0000 mg | ORAL_CAPSULE | Freq: Two times a day (BID) | ORAL | Status: DC
  Administered 2017-09-17: 100 mg via ORAL
  Filled 2017-09-17 (×2): qty 1

## 2017-09-17 MED ORDER — METHOCARBAMOL 1000 MG/10ML IJ SOLN
500.0000 mg | Freq: Four times a day (QID) | INTRAVENOUS | Status: DC | PRN
  Filled 2017-09-17: qty 5

## 2017-09-17 MED ORDER — LACTATED RINGERS IV SOLN: INTRAVENOUS | Status: DC

## 2017-09-17 MED ORDER — THROMBIN 20000 UNITS EX SOLR
CUTANEOUS | Status: AC
  Filled 2017-09-17: qty 20000

## 2017-09-17 MED ORDER — SODIUM CHLORIDE 0.9 % IV SOLN
INTRAVENOUS | Status: DC | PRN
  Administered 2017-09-17: 500 mL

## 2017-09-17 MED ORDER — DEXTROSE 5 % IV SOLN
INTRAVENOUS | Status: DC | PRN
  Administered 2017-09-17: 20 ug/min via INTRAVENOUS

## 2017-09-17 MED ORDER — EPHEDRINE SULFATE 50 MG/ML IJ SOLN
INTRAMUSCULAR | Status: AC
  Filled 2017-09-17: qty 1

## 2017-09-17 MED ORDER — LISINOPRIL 10 MG PO TABS
10.0000 mg | ORAL_TABLET | Freq: Every day | ORAL | Status: DC
  Administered 2017-09-17: 10 mg via ORAL
  Filled 2017-09-17: qty 1

## 2017-09-17 MED ORDER — AMLODIPINE BESYLATE 5 MG PO TABS
5.0000 mg | ORAL_TABLET | Freq: Every evening | ORAL | Status: DC
  Administered 2017-09-17: 5 mg via ORAL
  Filled 2017-09-17: qty 1

## 2017-09-17 MED ORDER — ONDANSETRON HCL 4 MG/2ML IJ SOLN
INTRAMUSCULAR | Status: AC
  Filled 2017-09-17: qty 2

## 2017-09-17 MED ORDER — PHENOL 1.4 % MT LIQD: 1.0000 | OROMUCOSAL | Status: DC | PRN

## 2017-09-17 MED ORDER — ROCURONIUM BROMIDE 10 MG/ML (PF) SYRINGE
PREFILLED_SYRINGE | INTRAVENOUS | Status: DC | PRN
  Administered 2017-09-17: 50 mg via INTRAVENOUS
  Administered 2017-09-17: 10 mg via INTRAVENOUS
  Administered 2017-09-17 (×2): 20 mg via INTRAVENOUS

## 2017-09-17 MED ORDER — OXYCODONE HCL 5 MG PO TABS
5.0000 mg | ORAL_TABLET | ORAL | Status: DC | PRN
  Administered 2017-09-17 – 2017-09-18 (×2): 5 mg via ORAL
  Filled 2017-09-17: qty 1

## 2017-09-17 MED ORDER — 0.9 % SODIUM CHLORIDE (POUR BTL) OPTIME
TOPICAL | Status: DC | PRN
  Administered 2017-09-17: 1000 mL

## 2017-09-17 MED ORDER — ALUM & MAG HYDROXIDE-SIMETH 200-200-20 MG/5ML PO SUSP: 30.0000 mL | Freq: Four times a day (QID) | ORAL | Status: DC | PRN

## 2017-09-17 MED ORDER — DOCUSATE SODIUM 100 MG PO CAPS: 100.0000 mg | ORAL_CAPSULE | Freq: Two times a day (BID) | ORAL | 0 refills | Status: DC | PRN

## 2017-09-17 MED ORDER — SUGAMMADEX SODIUM 200 MG/2ML IV SOLN
INTRAVENOUS | Status: DC | PRN
  Administered 2017-09-17: 227.8 mg via INTRAVENOUS

## 2017-09-17 MED ORDER — POLYETHYLENE GLYCOL 3350 17 G PO PACK: 17.0000 g | PACK | Freq: Every day | ORAL | Status: DC | PRN

## 2017-09-17 SURGICAL SUPPLY — 56 items
BAG DECANTER FOR FLEXI CONT (MISCELLANEOUS) ×3 IMPLANT
CLEANER TIP ELECTROSURG 2X2 (MISCELLANEOUS) ×3 IMPLANT
CLOSURE WOUND 1/2 X4 (GAUZE/BANDAGES/DRESSINGS) ×1
CLOTH 2% CHLOROHEXIDINE 3PK (PERSONAL CARE ITEMS) ×3 IMPLANT
CONT SPEC 4OZ CLIKSEAL STRL BL (MISCELLANEOUS) ×3 IMPLANT
DRAPE LAPAROTOMY 100X72X124 (DRAPES) ×3 IMPLANT
DRAPE MICROSCOPE LEICA (MISCELLANEOUS) ×3 IMPLANT
DRAPE SHEET LG 3/4 BI-LAMINATE (DRAPES) ×3 IMPLANT
DRAPE SURG 17X11 SM STRL (DRAPES) ×3 IMPLANT
DRAPE UTILITY XL STRL (DRAPES) ×3 IMPLANT
DRSG AQUACEL AG ADV 3.5X 4 (GAUZE/BANDAGES/DRESSINGS) IMPLANT
DRSG AQUACEL AG ADV 3.5X 6 (GAUZE/BANDAGES/DRESSINGS) ×3 IMPLANT
DRSG TEGADERM 4X4.75 (GAUZE/BANDAGES/DRESSINGS) ×3 IMPLANT
DRSG TELFA 3X8 NADH (GAUZE/BANDAGES/DRESSINGS) IMPLANT
DURAPREP 26ML APPLICATOR (WOUND CARE) ×3 IMPLANT
DURASEAL SPINE SEALANT 3ML (MISCELLANEOUS) IMPLANT
ELECT BLADE 4.0 EZ CLEAN MEGAD (MISCELLANEOUS) ×3
ELECT REM PT RETURN 9FT ADLT (ELECTROSURGICAL) ×3
ELECTRODE BLDE 4.0 EZ CLN MEGD (MISCELLANEOUS) ×1 IMPLANT
ELECTRODE REM PT RTRN 9FT ADLT (ELECTROSURGICAL) ×1 IMPLANT
EVACUATOR 1/8 PVC DRAIN (DRAIN) ×3 IMPLANT
GAUZE SPONGE 4X4 12PLY STRL (GAUZE/BANDAGES/DRESSINGS) ×3 IMPLANT
GLOVE BIOGEL PI IND STRL 6.5 (GLOVE) ×1 IMPLANT
GLOVE BIOGEL PI IND STRL 7.0 (GLOVE) ×1 IMPLANT
GLOVE BIOGEL PI INDICATOR 6.5 (GLOVE) ×2
GLOVE BIOGEL PI INDICATOR 7.0 (GLOVE) ×2
GLOVE SURG SS PI 6.0 STRL IVOR (GLOVE) ×3 IMPLANT
GLOVE SURG SS PI 7.5 STRL IVOR (GLOVE) ×15 IMPLANT
GLOVE SURG SS PI 8.0 STRL IVOR (GLOVE) ×6 IMPLANT
GOWN STRL REUS W/ TWL LRG LVL3 (GOWN DISPOSABLE) ×2 IMPLANT
GOWN STRL REUS W/ TWL XL LVL3 (GOWN DISPOSABLE) ×1 IMPLANT
GOWN STRL REUS W/TWL LRG LVL3 (GOWN DISPOSABLE) ×4
GOWN STRL REUS W/TWL XL LVL3 (GOWN DISPOSABLE) ×2
IV CATH 14GX2 1/4 (CATHETERS) ×3 IMPLANT
KIT BASIN OR (CUSTOM PROCEDURE TRAY) ×3 IMPLANT
NEEDLE 22X1 1/2 (OR ONLY) (NEEDLE) ×3 IMPLANT
NEEDLE SPNL 18GX3.5 QUINCKE PK (NEEDLE) ×9 IMPLANT
PACK LAMINECTOMY NEURO (CUSTOM PROCEDURE TRAY) ×3 IMPLANT
PATTIES SURGICAL .75X.75 (GAUZE/BANDAGES/DRESSINGS) ×3 IMPLANT
RUBBERBAND STERILE (MISCELLANEOUS) ×6 IMPLANT
SPONGE LAP 4X18 RFD (DISPOSABLE) ×6 IMPLANT
SPONGE SURGIFOAM ABS GEL 100 (HEMOSTASIS) ×3 IMPLANT
STAPLER VISISTAT (STAPLE) ×3 IMPLANT
STRIP CLOSURE SKIN 1/2X4 (GAUZE/BANDAGES/DRESSINGS) ×2 IMPLANT
SUT NURALON 4 0 TR CR/8 (SUTURE) IMPLANT
SUT PROLENE 3 0 PS 2 (SUTURE) IMPLANT
SUT VIC AB 1 CT1 27 (SUTURE) ×4
SUT VIC AB 1 CT1 27XBRD ANTBC (SUTURE) ×2 IMPLANT
SUT VIC AB 1-0 CT2 27 (SUTURE) IMPLANT
SUT VIC AB 2-0 CT1 27 (SUTURE)
SUT VIC AB 2-0 CT1 TAPERPNT 27 (SUTURE) IMPLANT
SUT VIC AB 2-0 CT2 27 (SUTURE) ×9 IMPLANT
SYR 3ML LL SCALE MARK (SYRINGE) ×3 IMPLANT
TOWEL GREEN STERILE (TOWEL DISPOSABLE) ×3 IMPLANT
TOWEL GREEN STERILE FF (TOWEL DISPOSABLE) ×3 IMPLANT
YANKAUER SUCT BULB TIP NO VENT (SUCTIONS) ×3 IMPLANT

## 2017-09-17 NOTE — Brief Op Note (Signed)
09/17/2017  10:48 AM  PATIENT:  Glenn Mcdonald  67 y.o. male  PRE-OPERATIVE DIAGNOSIS:  Stenosis L4-5, L5-S1 recurrent  POST-OPERATIVE DIAGNOSIS:  Stenosis Lumbar Four-Five,Lumbar Five-Sacral One Recurrent  PROCEDURE:  Procedure(s) with comments: Revision Microlumbar Decompression Lumbar Four-Five, Lumbar Five-Sacral One Bilaterally (N/A) - Revision Microlumbar Decompression Lumbar Four-Five, Lumbar Five-Sacral One Bilaterally   SURGEON:  Surgeon(s) and Role:    * Jene EveryBeane, Lucah Petta, MD - Primary  PHYSICIAN ASSISTANT:   ASSISTANTS: Bissell   ANESTHESIA:   general  EBL:   200cc  BLOOD ADMINISTERED:none  DRAINS: Hemovac to self 1/4 suction   LOCAL MEDICATIONS USED:  MARCAINE     SPECIMEN:  No Specimen  DISPOSITION OF SPECIMEN:  N/A  COUNTS:  YES  TOURNIQUET:  * No tourniquets in log *  DICTATION: .Other Dictation: Dictation Number [id number]  PLAN OF CARE: Admit for overnight observation  PATIENT DISPOSITION:  PACU - hemodynamically stable.   Delay start of Pharmacological VTE agent (>24hrs) due to surgical blood loss or risk of bleeding: yes

## 2017-09-17 NOTE — Interval H&P Note (Signed)
History and Physical Interval Note:  09/17/2017 7:20 AM  Glenn Mcdonald  has presented today for surgery, with the diagnosis of Stenosis L4-5, L5-S1 recurrent  The various methods of treatment have been discussed with the patient and family. After consideration of risks, benefits and other options for treatment, the patient has consented to  Procedure(s) with comments: Revision Microlumbar decompression L4-5, L5-S1 (N/A) - 2.5 hrs requested as a surgical intervention .  The patient's history has been reviewed, patient examined, no change in status, stable for surgery.  I have reviewed the patient's chart and labs.  Questions were answered to the patient's satisfaction.    Now with right sided sx. L5, S1 distribution. Left EHL 4/5.    Glenn Mcdonald

## 2017-09-17 NOTE — Anesthesia Postprocedure Evaluation (Signed)
Anesthesia Post Note  Patient: Arlington CalixSteven Wieand  Procedure(s) Performed: Revision Microlumbar Decompression Lumbar Four-Five, Lumbar Five-Sacral One Bilaterally (N/A Spine Lumbar)     Patient location during evaluation: PACU Anesthesia Type: General Level of consciousness: awake Pain management: pain level controlled Vital Signs Assessment: post-procedure vital signs reviewed and stable Cardiovascular status: stable Anesthetic complications: no    Last Vitals:  Vitals:   09/17/17 1211 09/17/17 1601  BP: 132/65 (!) 155/81  Pulse: 67 79  Resp: 20 18  Temp: 36.6 C (!) 36.4 C  SpO2: 92% 98%    Last Pain:  Vitals:   09/17/17 1601  TempSrc: Oral  PainSc:                  Zayonna Ayuso

## 2017-09-17 NOTE — Anesthesia Preprocedure Evaluation (Signed)
Anesthesia Evaluation  Patient identified by MRN, date of birth, ID band Patient awake    Reviewed: Allergy & Precautions, NPO status , Patient's Chart, lab work & pertinent test results  Airway Mallampati: II  TM Distance: >3 FB     Dental   Pulmonary asthma ,    breath sounds clear to auscultation       Cardiovascular hypertension,  Rhythm:Regular Rate:Normal     Neuro/Psych    GI/Hepatic negative GI ROS, Neg liver ROS,   Endo/Other  negative endocrine ROS  Renal/GU negative Renal ROS     Musculoskeletal   Abdominal   Peds  Hematology   Anesthesia Other Findings   Reproductive/Obstetrics                             Anesthesia Physical Anesthesia Plan  ASA: III  Anesthesia Plan: General   Post-op Pain Management:    Induction: Intravenous  PONV Risk Score and Plan: 2 and Treatment may vary due to age or medical condition  Airway Management Planned: Oral ETT  Additional Equipment:   Intra-op Plan:   Post-operative Plan: Extubation in OR  Informed Consent: I have reviewed the patients History and Physical, chart, labs and discussed the procedure including the risks, benefits and alternatives for the proposed anesthesia with the patient or authorized representative who has indicated his/her understanding and acceptance.   Dental advisory given  Plan Discussed with: CRNA and Anesthesiologist  Anesthesia Plan Comments:         Anesthesia Quick Evaluation

## 2017-09-17 NOTE — Evaluation (Signed)
Physical Therapy Evaluation Patient Details Name: Glenn Mcdonald MRN: [id number] DOB: 03-Mar-1951 Today's Date: 09/17/2017   History of Present Illness  Pt is a 67 y/o male s/p L4-S1 microlumbar decompression. PMH includes HTN, scoliosis, and laminectomy.   Clinical Impression  Patient is s/p above surgery resulting in the deficits listed below (see PT Problem List). Pt with slight unsteadiness during gait, however, otherwise tolerated well. Pt requiring min guard A for steadying. Reviewed back precautions and generalized walking program.  Patient will benefit from skilled PT to increase their independence and safety with mobility (while adhering to their precautions) to allow discharge to the venue listed below.     Follow Up Recommendations No PT follow up;Supervision for mobility/OOB    Equipment Recommendations  3in1 (PT)    Recommendations for Other Services       Precautions / Restrictions Precautions Precautions: Back Precaution Booklet Issued: Yes (comment) Precaution Comments: Reviewed back precautions with pt.  Restrictions Weight Bearing Restrictions: No      Mobility  Bed Mobility Overal bed mobility: Needs Assistance Bed Mobility: Rolling;Sidelying to Sit;Sit to Sidelying Rolling: Supervision Sidelying to sit: Supervision     Sit to sidelying: Supervision General bed mobility comments: Supervision to ensure log roll technique. Verbal cues for sequencing.   Transfers Overall transfer level: Needs assistance Equipment used: None Transfers: Sit to/from Stand Sit to Stand: Min guard;From elevated surface         General transfer comment: Min guard for safety from elevated surface.   Ambulation/Gait Ambulation/Gait assistance: Min guard Gait Distance (Feet): 125 Feet Assistive device: IV Pole Gait Pattern/deviations: Step-through pattern;Decreased stride length Gait velocity: Decreased    General Gait Details: Slow, slightly unsteady gait, however, no  overt LOB noted. Used IV pole for steadying assist. Educated about generalized walking program.   Information systems manager Rankin (Stroke Patients Only)       Balance Overall balance assessment: Needs assistance Sitting-balance support: No upper extremity supported;Feet supported Sitting balance-Leahy Scale: Good     Standing balance support: Single extremity supported;No upper extremity supported;During functional activity Standing balance-Leahy Scale: Fair Standing balance comment: Able to maintain static standing without UE support.                              Pertinent Vitals/Pain Pain Assessment: 0-10 Pain Score: 4  Pain Location: back  Pain Descriptors / Indicators: Aching;Operative site guarding Pain Intervention(s): Limited activity within patient's tolerance;Monitored during session;Repositioned    Home Living Family/patient expects to be discharged to:: Private residence Living Arrangements: Spouse/significant other Available Help at Discharge: Family;Available 24 hours/day Type of Home: House Home Access: Stairs to enter Entrance Stairs-Rails: None Entrance Stairs-Number of Steps: 2 Home Layout: Two level;Able to live on main level with bedroom/bathroom Home Equipment: None      Prior Function Level of Independence: Independent               Hand Dominance        Extremity/Trunk Assessment   Upper Extremity Assessment Upper Extremity Assessment: Defer to OT evaluation    Lower Extremity Assessment Lower Extremity Assessment: Overall WFL for tasks assessed    Cervical / Trunk Assessment Cervical / Trunk Assessment: Other exceptions Cervical / Trunk Exceptions: s/p lumbar surgery   Communication   Communication: No difficulties  Cognition Arousal/Alertness: Awake/alert Behavior During Therapy: WFL for tasks assessed/performed  Overall Cognitive Status: Within Functional Limits for tasks  assessed                                        General Comments      Exercises     Assessment/Plan    PT Assessment Patient needs continued PT services  PT Problem List Decreased balance;Decreased mobility;Decreased knowledge of precautions;Pain       PT Treatment Interventions Gait training;Stair training;Functional mobility training;Therapeutic activities;Therapeutic exercise;Balance training;Patient/family education    PT Goals (Current goals can be found in the Care Plan section)  Acute Rehab PT Goals Patient Stated Goal: to go home  PT Goal Formulation: With patient Time For Goal Achievement: 10/01/17 Potential to Achieve Goals: Good    Frequency Min 5X/week   Barriers to discharge        Co-evaluation               AM-PAC PT "6 Clicks" Daily Activity  Outcome Measure Difficulty turning over in bed (including adjusting bedclothes, sheets and blankets)?: A Little Difficulty moving from lying on back to sitting on the side of the bed? : A Little Difficulty sitting down on and standing up from a chair with arms (e.g., wheelchair, bedside commode, etc,.)?: Unable Help needed moving to and from a bed to chair (including a wheelchair)?: A Little Help needed walking in hospital room?: A Little Help needed climbing 3-5 steps with a railing? : A Little 6 Click Score: 16    End of Session   Activity Tolerance: Patient tolerated treatment well Patient left: in bed;with call bell/phone within reach Nurse Communication: Mobility status PT Visit Diagnosis: Other abnormalities of gait and mobility (R26.89);Pain Pain - part of body: (back )    Time: [id number] PT Time Calculation (min) (ACUTE ONLY): 12 min   Charges:   PT Evaluation $PT Eval Low Complexity: 1 Low     PT G Codes:        Gladys DammeBrittany Hines Kloss, PT, DPT  Acute Rehabilitation Services  Pager: [phone]   Lehman PromBrittany S Treyvion Durkee 09/17/2017, 6:08 PM

## 2017-09-17 NOTE — Anesthesia Procedure Notes (Signed)
Procedure Name: Intubation Date/Time: 09/17/2017 7:39 AM Performed by: Adria Dillonkin, Shirin Echeverry A, CRNA Pre-anesthesia Checklist: Patient identified, Emergency Drugs available, Suction available and Patient being monitored Patient Re-evaluated:Patient Re-evaluated prior to induction Oxygen Delivery Method: Circle system utilized Preoxygenation: Pre-oxygenation with 100% oxygen Induction Type: IV induction Ventilation: Mask ventilation without difficulty Laryngoscope Size: Miller and 3 Grade View: Grade I Tube type: Oral Tube size: 7.5 mm Number of attempts: 1 Airway Equipment and Method: Stylet Placement Confirmation: ETT inserted through vocal cords under direct vision,  positive ETCO2,  CO2 detector and breath sounds checked- equal and bilateral Secured at: 22 cm Tube secured with: Tape Dental Injury: Teeth and Oropharynx as per pre-operative assessment

## 2017-09-17 NOTE — Discharge Instructions (Signed)

## 2017-09-17 NOTE — Transfer of Care (Signed)
Immediate Anesthesia Transfer of Care Note  Patient: Glenn Mcdonald  Procedure(s) Performed: Revision Microlumbar Decompression Lumbar Four-Five, Lumbar Five-Sacral One Bilaterally (N/A Spine Lumbar)  Patient Location: PACU  Anesthesia Type:General  Level of Consciousness: awake, oriented, drowsy and patient cooperative  Airway & Oxygen Therapy: Patient Spontanous Breathing and Patient connected to nasal cannula oxygen  Post-op Assessment: Report given to RN and Post -op Vital signs reviewed and stable  Post vital signs: Reviewed and stable  Last Vitals:  Vitals Value Taken Time  BP 133/63 09/17/2017 11:02 AM  Temp 36.5 C 09/17/2017 11:02 AM  Pulse 77 09/17/2017 11:05 AM  Resp 14 09/17/2017 11:05 AM  SpO2 93 % 09/17/2017 11:05 AM  Vitals shown include unvalidated device data.  Last Pain:  Vitals:   09/17/17 1102  TempSrc:   PainSc: Asleep      Patients Stated Pain Goal: 3 (09/17/17 [id number])  Complications: No apparent anesthesia complications

## 2017-09-17 NOTE — Op Note (Signed)
[place]: [place], [place] MEDICAL RECORD [id number] ACCOUNT [id number] DATE OF BIRTH:September 13, 1950 FACILITY: [facility] LOCATION: [facility] PHYSICIAN:Lakhia Gengler Connye Burkitt. Tyisha Cressy, MD  OPERATIVE REPORT  DATE OF PROCEDURE:  09/17/2017  PREOPERATIVE DIAGNOSES:  Recurrent spinal stenosis L4-L5 and L5-S1.  POSTOPERATIVE DIAGNOSES:  Recurrent spinal stenosis L4-L5 and L5-S1.  PROCEDURE PERFORMED: 1.  Revision microlumbar decompression L4-L5, L5-S1. 2.  Bilateral lateral recess decompression with foraminotomies of, L4, L5, S1. 3.  Laminectomy of L5.  ANESTHESIA:  General.  SURGEON:  Jene EveryJeffrey Bane Hagy, MD  ASSISTANT:  Andrez GrimeJaclyn Bissell, PA  HISTORY:  This 67 year old male with history of lumbar decompression L5-S1 to the left.  The patient had recurrent lower extremity radicular pain, predominantly right then left-sided.  Myelogram indicating stenosis noted at L4-L5 and L5-S1.  He had a  transitional segment, history of lumbar decompression, L5-S1.  He had lateral recess stenosis.  He had L5-S1 radicular pain.  EHL weakness, left greater than right.  Due to myelogram findings and the persistent symptoms refractory to conservative  treatment including rest, activity modification, physical therapy and injection therapy with temporary relief, we discussed options, living with the symptoms versus microlumbar decompression.  I discussed 1-2 level with the risks and benefits including  bleeding, infection, damage to neurovascular structures.  No change in symptoms, worsening of symptoms, DVT, PE, anesthetic complications, need for fusion in the future, persistent symptoms, etc.  DESCRIPTION OF PROCEDURE:  The patient in supine position.  After induction of adequate general anesthesia, 2 grams Kefzol and was placed prone on the [place] table with the flexion sling utilized.  The abdomen was free.  Foley to gravity.  Arms padded.   Cervical spine in the neutral position.  Lumbar region was then prepped and draped in usual sterile  fashion.  Two 18-gauge spinal needles was utilized to localize the L4-L5 and L5-S1 interspace.  This was confirmed by x-ray.  I made an incision at above  the spinous process of 4 and below 5.  Subcutaneous tissue was dissected with Cardiolite achieve hemostasis.  Marcaine, 0.25%, with epinephrine was infiltrated in the paraspinous musculature at 4-5 and 5-1.  A McCullough retractor was placed.  Operating  microscope was draped and brought in the surgical field with a confirmatory radiograph obtained at the spinous processes of 4 and 5.  There was shingling noted and a scoliosis that was apparent.  He did have a shingling in a very small interlaminar  window at 4-5 and 5-1.  I removed the spinous processes of 4 and 5.  Bone wax placed in the cancellous surfaces.  We skeletonized the previous hemilaminotomy, identifying the facets at L4-L5 and L5-S1 bilaterally.  There was some concavity on the  left-hand side.  First, I utilized a micro curet to detach ligamentum flavum from the cardia at age of 5 bilaterally and cephalad edge of S1 bilaterally with epidural fibrosis noted predominantly on the left at L5-S1.  First decompressed on the right.   After hemilaminotomy was performed at 5 on the right, detached the ligamentum flavum.  Neuro patties were placed to protect the neural elements to decompress the lateral recess on the right to the medial border of the pedicle.  Hypertrophic facet and  ligamentum flavum hypertrophy was noted.  Compression of the L5-S1 nerve roots on the right.  Foraminotomy of 5 was performed as well.  Hemilaminotomy at cephalad edge of S1 were performed as well with the neural elements well protected.  These were all  compressive structures.  No disk herniation was noted  on that side.  We continued to the left hand side.  There is epidural fibrosis with adhesions noted on the inferior aspect of 5 on the left.  I decided to make an interlaminar window at 4-5.   Hemilaminotomies were  performed at [facility] of 4 bilaterally.  Straight curette utilized to detach the ligamentum flavum from the cephalad edge of 5.  Hypertrophic facet on the left was noted with a facet synovial cyst, which was removed.  After  skeletonizing the lamina, I detached the ligamentum flavum from the caudad edge of L4 bilaterally removing ligamentum flavum from the interspace at L4-L5.  First on the right at the area of the convexity.  Neural elements were protected.  I used the 2 mm  Kerrison to decompress the lateral recess to the medial border of the pedicle and a hemilaminotomy of the cephalad edge of 5 was performed as well bilaterally.  There was compression of the lateral recess, particularly on the left at L4-5.  I performed  foraminotomies.  There was compression of the neural arch of 5 on the left.  I therefore placed patties beneath the neural arch and felt a neurolysis would be required to be removed to fully decompress the lateral recess on the left in the 5 root.  This  was then removed.  The ends were smoothed with a straight microcurette.  There is no disk herniation noted.  Foraminotomy again was performed at 4 and 5.  I decompressed both lateral recesses,  preserving the facets bilaterally.  They continued into the  lateral recess on the left where the previous hemilaminotomy was performed.  With a microcurette we gently developed a plane between the epidural fibrosis and the previous laminotomy, protecting neural elements to decompress the lateral recess to the  medial border of the pedicle.  A superior articulating process of S1 was identified.  Previous cortisone remnants were noted.  The stenotic into S1 performed a gentle foraminotomy of S1 protecting the S1 nerve root.  There was epidural fibrosis noted  around the S1 nerve root on the left.  No disk herniation.  Following decompression neuro probe passed freely out the foramen at 4-5, S1 bilaterally.  No active bleeding or CSF leakage.  I  meticulously placed  bone wax on the cancellous surfaces.  I  copiously irrigated with saline with antibiotic irrigation.  No active bleeding or CSF leakage.  There was 1 patty of thrombin-soaked Gelfoam over the laminotomy defect and draped the epidural fat over that at L5-S1.  We felt there was good restoration  of the thecal sac and decompression noted.  Pathology consistent with the symptoms.  We removed the [facility] retractor.  Paraspinous muscles were copiously irrigated.  Electrocautery was utilized to achieve hemostasis.  I placed a Hemovac brought out through lateral stab wound on the skin.  Repaired the dorsal lumbar fascia with #1  Vicryl interrupted figure-of-eight sutures, subcu with 2-0 and skin with staples.  The Hemovac slid without obstruction.    Sterile dressing applied, placed gently supine on the hospital bed, extubated without difficulty and transported to the recovery room in satisfactory condition.  The patient tolerated the procedure well.  No complications.  Assistant Andrez Grime, [place].   Blood loss was 150-200 mL.  AN/NUANCE  D:09/17/2017 T:09/17/2017 JOB:[id number]

## 2017-09-18 ENCOUNTER — Encounter (HOSPITAL_COMMUNITY): Payer: Self-pay | Admitting: Specialist

## 2017-09-18 DIAGNOSIS — M48061 Spinal stenosis, lumbar region without neurogenic claudication: Secondary | ICD-10-CM | POA: Diagnosis not present

## 2017-09-18 LAB — BASIC METABOLIC PANEL
ANION GAP: 7 (ref 5–15)
BUN: 14 mg/dL (ref 6–20)
CHLORIDE: 105 mmol/L (ref 101–111)
CO2: 26 mmol/L (ref 22–32)
CREATININE: 0.91 mg/dL (ref 0.61–1.24)
Calcium: 8.9 mg/dL (ref 8.9–10.3)
GFR calc non Af Amer: >60 mL/min (ref >60)
Glucose, Bld: 119 mg/dL — ABNORMAL HIGH (ref 65–99)
Potassium: 4 mmol/L (ref 3.5–5.1)
Sodium: 138 mmol/L (ref 135–145)

## 2017-09-18 LAB — CBC
HCT: 40.7 % (ref 39.0–52.0)
HEMOGLOBIN: 13.4 g/dL (ref 13.0–17.0)
MCH: 31.8 pg (ref 26.0–34.0)
MCHC: 32.9 g/dL (ref 30.0–36.0)
MCV: 96.7 fL (ref 78.0–100.0)
Platelets: 210 10*3/uL (ref 150–400)
RBC: 4.21 MIL/uL — ABNORMAL LOW (ref 4.22–5.81)
RDW: 11.7 % (ref 11.5–15.5)
WBC: 13.8 10*3/uL — AB (ref 4.0–10.5)

## 2017-09-18 NOTE — Progress Notes (Signed)
Pt doing well. Pt and wife given D/C instructions with Rx's, verbal understanding was provided. Pt's incision is clean and dry with no sign of infection. Pt's IV was removed prior to D/C. Pt received 3-n-1 from [facility] per MD order. Pt D/C'd home via wheelchair @ 1045 per MD order. Pt is stable @ D/C and has no other needs at this time. Rema FendtAshley Danielle Lento, RN

## 2017-09-18 NOTE — Progress Notes (Signed)
Physical Therapy Treatment and Discharge Patient Details Name: Glenn Mcdonald MRN: [id number] DOB: April 10, 1950 Today's Date: 09/18/2017    History of Present Illness Pt is a 67 y/o male s/p L4-S1 microlumbar decompression. PMH includes HTN, scoliosis, and laminectomy.     PT Comments    Patient met all physical therapy goals during his inpatient stay. Demonstrating excellent posture, gait speed, and balance during ambulating 400 feet without a device. Also able to negotiate 10 steps with use of railing modified independently to simulate home set up. Displays good pain control. Reviewed education and patient has no further questions/concerns. PT signing off.    Follow Up Recommendations  No PT follow up;Supervision for mobility/OOB     Equipment Recommendations  3in1 (PT)    Recommendations for Other Services       Precautions / Restrictions Precautions Precautions: Back Precaution Booklet Issued: Yes (comment) Precaution Comments: Reviewed back precautions with pt.  Restrictions Weight Bearing Restrictions: No    Mobility  Bed Mobility               General bed mobility comments: sitting EOB upon entry  Transfers Overall transfer level: Independent Equipment used: None Transfers: Sit to/from Stand Sit to Stand: Supervision            Ambulation/Gait Ambulation/Gait assistance: Independent Gait Distance (Feet): 400 Feet   Gait Pattern/deviations: WFL(Within Functional Limits)     General Gait Details: Patient with good gait speed and upright posture   Stairs Stairs: Yes Stairs assistance: Modified independent (Device/Increase time) Stair Management: One rail Right Number of Stairs: 10 General stair comments: step over step pattern with use of railing   Wheelchair Mobility    Modified Rankin (Stroke Patients Only)       Balance Overall balance assessment: Needs assistance Sitting-balance support: No upper extremity supported;Feet  supported Sitting balance-Leahy Scale: Good     Standing balance support: No upper extremity supported;During functional activity Standing balance-Leahy Scale: Good Standing balance comment: Static standing within base of support without UE or external support                             Cognition Arousal/Alertness: Awake/alert Behavior During Therapy: WFL for tasks assessed/performed Overall Cognitive Status: Within Functional Limits for tasks assessed                                        Exercises      General Comments        Pertinent Vitals/Pain Pain Assessment: No/denies pain Faces Pain Scale: Hurts a little bit Pain Location: back  Pain Descriptors / Indicators: Aching;Operative site guarding Pain Intervention(s): Limited activity within patient's tolerance;Monitored during session    Home Living Family/patient expects to be discharged to:: Private residence Living Arrangements: Spouse/significant other Available Help at Discharge: Family;Available 24 hours/day Type of Home: House Home Access: Stairs to enter Entrance Stairs-Rails: None Home Layout: Two level;Able to live on main level with bedroom/bathroom Home Equipment: None      Prior Function Level of Independence: Independent      Comments: independent with ADL, IADL and mobility    PT Goals (current goals can now be found in the care plan section) Acute Rehab PT Goals Patient Stated Goal: to go home  PT Goal Formulation: With patient Time For Goal Achievement: 10/01/17 Potential to Achieve Goals: Good Progress  towards PT goals: Goals met/education completed, patient discharged from PT    Frequency    Min 5X/week      PT Plan Other (comment)(d/c therapies)    Co-evaluation              AM-PAC PT "6 Clicks" Daily Activity  Outcome Measure  Difficulty turning over in bed (including adjusting bedclothes, sheets and blankets)?: None Difficulty moving from  lying on back to sitting on the side of the bed? : None Difficulty sitting down on and standing up from a chair with arms (e.g., wheelchair, bedside commode, etc,.)?: None Help needed moving to and from a bed to chair (including a wheelchair)?: None Help needed walking in hospital room?: None Help needed climbing 3-5 steps with a railing? : None 6 Click Score: 24    End of Session Equipment Utilized During Treatment: Gait belt Activity Tolerance: Patient tolerated treatment well Patient left: Other (comment)(handoff to OT in room) Nurse Communication: Mobility status PT Visit Diagnosis: Other abnormalities of gait and mobility (R26.89);Pain Pain - part of body: (back )     Time: 9470-9628 PT Time Calculation (min) (ACUTE ONLY): 16 min  Charges:  $Therapeutic Activity: 8-22 mins                    G Codes:       Ellamae Sia, PT, DPT Acute Rehabilitation Services  Pager: [phone]    Willy Eddy 09/18/2017, 10:08 AM

## 2017-09-18 NOTE — Progress Notes (Signed)
Subjective: 1 Day Post-Op Procedure(s) (LRB): Revision Microlumbar Decompression Lumbar Four-Five, Lumbar Five-Sacral One Bilaterally (N/A) Patient reports pain as mild.  Leg pain improved. Noting incisional back pain. Voiding without difficulty. No c/o. Feels ready to go home.  Objective: Vital signs in last 24 hours: Temp:  [97.5 F (36.4 C)-98.1 F (36.7 C)] 98.1 F (36.7 C) (06/14 0415) Pulse Rate:  [67-80] 67 (06/14 0415) Resp:  [8-20] 20 (06/14 0415) BP: (117-160)/(55-81) 117/68 (06/14 0415) SpO2:  [92 %-99 %] 98 % (06/14 0415)  Intake/Output from previous day: 06/13 0701 - 06/14 0700 In: 1550 [I.V.:1450; IV Piggyback:100] Out: 1180 [Urine:760; Drains:170; Blood:250] Intake/Output this shift: No intake/output data recorded.  Recent Labs    09/18/17 0632  HGB 13.4   Recent Labs    09/18/17 0632  WBC 13.8*  RBC 4.21*  HCT 40.7  PLT 210   Recent Labs    09/18/17 0632  NA 138  K 4.0  CL 105  CO2 26  BUN 14  CREATININE 0.91  GLUCOSE 119*  CALCIUM 8.9   No results for input(s): LABPT, INR in the last 72 hours.  Neurologically intact ABD soft Neurovascular intact Sensation intact distally Intact pulses distally Dorsiflexion/Plantar flexion intact Incision: dressing C/D/I and scant drainage No cellulitis present Compartment soft no calf pain or sign of DVT  Hemovac drain D/C'd tip intact no bleeding at drain site  Assessment/Plan: 1 Day Post-Op Procedure(s) (LRB): Revision Microlumbar Decompression Lumbar Four-Five, Lumbar Five-Sacral One Bilaterally (N/A) Advance diet Up with therapy D/C IV fluids Discussed D/C instructions, dressing instructions, Lspine precautions D/C home today Follow up in office 2 weeks post-op for staple removal Will discuss with Dr. Elissa LovettBeane   BISSELL, Dayna BarkerJACLYN M. 09/18/2017, 7:55 AM

## 2017-09-18 NOTE — Evaluation (Signed)
Occupational Therapy Evaluation Patient Details Name: Glenn Mcdonald MRN: [id number] DOB: 12/05/1950 Today's Date: 09/18/2017    History of Present Illness Pt is a 67 y/o male s/p L4-S1 microlumbar decompression. PMH includes HTN, scoliosis, and laminectomy.    Clinical Impression   PTA patient was independent with ADL, IADL and mobility.  He currently completes UB ADL with modified independence, LB ADL with supervision, grooming with supervision, simulated tub transfer with supervision.  Patient educated on back precautions, compensatory techniques, AE (reports having reacher available at home), safety, DME and mobility to maximize independence ADL participation.  Patient will benefit from 3 in 1 commode to elevate low commode and use as a shower seat.  Patient receptive to all education, eager to dc home today.  No OT follow up recommended, as he will have 24/7 support from family.  OT signing off.  Thank you for this referral.      Follow Up Recommendations  No OT follow up;Supervision/Assistance - 24 hour    Equipment Recommendations  3 in 1 bedside commode    Recommendations for Other Services       Precautions / Restrictions Precautions Precautions: Back Precaution Booklet Issued: No Precaution Comments: Reviewed back precautions with pt, booklet issued in previous PT session.  Restrictions Weight Bearing Restrictions: No      Mobility Bed Mobility               General bed mobility comments: seated EOB upon therapist entry   Transfers Overall transfer level: Needs assistance Equipment used: None Transfers: Sit to/from Stand Sit to Stand: Supervision              Balance Overall balance assessment: Needs assistance Sitting-balance support: No upper extremity supported;Feet supported Sitting balance-Leahy Scale: Good     Standing balance support: No upper extremity supported;During functional activity Standing balance-Leahy Scale: Fair Standing balance  comment: Static standing within base of support without UE or external support                            ADL either performed or assessed with clinical judgement   ADL Overall ADL's : Needs assistance/impaired Eating/Feeding: Independent   Grooming: Supervision/safety Grooming Details (indicate cue type and reason): educted on technqiues to avoid bending over sink, patient verbalizes understanding  Upper Body Bathing: Modified independent;Sitting   Lower Body Bathing: Supervison/ safety;Sit to/from stand;Adhering to back precautions Lower Body Bathing Details (indicate cue type and reason): educated on completion seated, using figure 4 method and having assistance nearby  Upper Body Dressing : Modified independent   Lower Body Dressing: Sit to/from stand;Supervision/safety;Adhering to back precautions Lower Body Dressing Details (indicate cue type and reason): using figure 4 technique to don/doff socks  Toilet Transfer: Supervision/safety;BSC StatisticianToilet Transfer Details (indicate cue type and reason): educated on use of 3 in 1 at discharge (height adjustment and use)     Tub/ Shower Transfer: Tub transfer;3 in 1;Cueing for safety;Supervision/safety Tub/Shower Transfer Details (indicate cue type and reason): simulated in room, educated on technique for precautions  Functional mobility during ADLs: Supervision/safety General ADL Comments: good safety awarness throughout, highly motivated      Vision Baseline Vision/History: Wears glasses Wears Glasses: At all times Patient Visual Report: No change from baseline       Perception     Praxis      Pertinent Vitals/Pain Pain Assessment: Faces Faces Pain Scale: Hurts a little bit Pain Location: back  Pain Descriptors /  Indicators: Aching;Operative site guarding Pain Intervention(s): Limited activity within patient's tolerance;Monitored during session     Hand Dominance     Extremity/Trunk Assessment Upper Extremity  Assessment Upper Extremity Assessment: Overall WFL for tasks assessed(reports "a little pulling" with overhead reach but not uncomfortable)   Lower Extremity Assessment Lower Extremity Assessment: Defer to PT evaluation   Cervical / Trunk Assessment Cervical / Trunk Assessment: Other exceptions Cervical / Trunk Exceptions: s/p lumbar surgery    Communication Communication Communication: No difficulties   Cognition Arousal/Alertness: Awake/alert Behavior During Therapy: WFL for tasks assessed/performed Overall Cognitive Status: Within Functional Limits for tasks assessed                                     General Comments       Exercises     Shoulder Instructions      Home Living Family/patient expects to be discharged to:: Private residence Living Arrangements: Spouse/significant other Available Help at Discharge: Family;Available 24 hours/day Type of Home: House Home Access: Stairs to enter [employer] of Steps: 2 Entrance Stairs-Rails: None Home Layout: Two level;Able to live on main level with bedroom/bathroom     Bathroom Shower/Tub: Tub/shower unit;Walk-in shower(has available walk in shower if needed)   Bathroom Toilet: Standard     Home Equipment: None          Prior Functioning/Environment Level of Independence: Independent        Comments: independent with ADL, IADL and mobility         OT Problem List: Decreased knowledge of precautions;Decreased knowledge of use of DME or AE;Decreased activity tolerance;Pain;Impaired balance (sitting and/or standing)      OT Treatment/Interventions:      OT Goals(Current goals can be found in the care plan section) Acute Rehab OT Goals Patient Stated Goal: to go home today OT Goal Formulation: With patient  OT Frequency:     Barriers to D/C:            Co-evaluation              AM-PAC PT "6 Clicks" Daily Activity     Outcome Measure Help from another person eating  meals?: None Help from another person taking care of personal grooming?: A Little Help from another person toileting, which includes using toliet, bedpan, or urinal?: A Little Help from another person bathing (including washing, rinsing, drying)?: A Little Help from another person to put on and taking off regular upper body clothing?: None Help from another person to put on and taking off regular lower body clothing?: A Little 6 Click Score: 20   End of Session Equipment Utilized During Treatment: Other (comment)(3 in 1) Nurse Communication: Mobility status  Activity Tolerance: Patient tolerated treatment well;No increased pain Patient left: Other (comment);with call bell/phone within reach(seated EOB)  OT Visit Diagnosis: Other abnormalities of gait and mobility (R26.89)                Time: 6962-9528 OT Time Calculation (min): 15 min Charges:  OT General Charges $OT Visit: 1 Visit OT Evaluation $OT Eval Low Complexity: 1 Low G-Codes:     Chancy Milroy, OTR/L  Pager [phone]   Chancy Milroy 09/18/2017, 10:04 AM

## 2017-09-18 NOTE — Discharge Summary (Signed)
Physician Discharge Summary   Patient ID: Uri Turnbough MRN: [id number] DOB/AGE: 1951/03/10 67 y.o.  Admit date: 09/17/2017 Discharge date: 09/18/2017  Primary Diagnosis:   Stenosis L4-5, L5-S1 recurrent  Admission Diagnoses:  Past Medical History:  Diagnosis Date  . Arthritis   . Asthma    out grew   . History of kidney stones   . Hypertension    Discharge Diagnoses:   Principal Problem:   Spinal stenosis of lumbar region Active Problems:   Spinal stenosis at L4-L5 level  Procedure:  Procedure(s) (LRB): Revision Microlumbar Decompression Lumbar Four-Five, Lumbar Five-Sacral One Bilaterally (N/A)   Consults: None  HPI:  see H&P    Laboratory Data: Hospital Outpatient Visit on 09/14/2017  Component Date Value Ref Range Status  . MRSA, PCR 09/14/2017 NEGATIVE  NEGATIVE Final  . Staphylococcus aureus 09/14/2017 NEGATIVE  NEGATIVE Final   Comment: (NOTE) The Xpert SA Assay (FDA approved for NASAL specimens in patients 41 years of age and older), is one component of a comprehensive surveillance program. It is not intended to diagnose infection nor to guide or monitor treatment. Performed at [facility], [place] [address], [place], [place] [address]   . Sodium 09/14/2017 141  135 - 145 mmol/L Final  . Potassium 09/14/2017 4.4  3.5 - 5.1 mmol/L Final  . Chloride 09/14/2017 108  101 - 111 mmol/L Final  . CO2 09/14/2017 26  22 - 32 mmol/L Final  . Glucose, Bld 09/14/2017 100* 65 - 99 mg/dL Final  . BUN 09/14/2017 11  6 - 20 mg/dL Final  . Creatinine, Ser 09/14/2017 0.86  0.61 - 1.24 mg/dL Final  . Calcium 09/14/2017 9.6  8.9 - 10.3 mg/dL Final  . GFR calc non Af Amer 09/14/2017 >60  >60 mL/min Final  . GFR calc Af Amer 09/14/2017 >60  >60 mL/min Final   Comment: (NOTE) The eGFR has been calculated using the CKD EPI equation. This calculation has not been validated in all clinical situations. eGFR's persistently <60 mL/min signify possible Chronic  Kidney Disease.   Georgiann Hahn gap 09/14/2017 7  5 - 15 Final   Performed at [facility], [place] [address] [place], [place] [address]  . WBC 09/14/2017 6.6  4.0 - 10.5 K/uL Final  . RBC 09/14/2017 4.60  4.22 - 5.81 MIL/uL Final  . Hemoglobin 09/14/2017 14.7  13.0 - 17.0 g/dL Final  . HCT 09/14/2017 44.0  39.0 - 52.0 % Final  . MCV 09/14/2017 95.7  78.0 - 100.0 fL Final  . MCH 09/14/2017 32.0  26.0 - 34.0 pg Final  . MCHC 09/14/2017 33.4  30.0 - 36.0 g/dL Final  . RDW 09/14/2017 11.4* 11.5 - 15.5 % Final  . Platelets 09/14/2017 220  150 - 400 K/uL Final   Performed at [facility], [place] [address], [place], [place] [address]   Recent Labs    09/18/17 [phone]  HGB 13.4   Recent Labs    09/18/17 0632  WBC 13.8*  RBC 4.21*  HCT 40.7  PLT 210   Recent Labs    09/18/17 0632  NA 138  K 4.0  CL 105  CO2 26  BUN 14  CREATININE 0.91  GLUCOSE 119*  CALCIUM 8.9   No results for input(s): LABPT, INR in the last 72 hours.  X-Rays:Dg Lumbar Spine 2-3 Views  Result Date: 09/14/2017 CLINICAL DATA:  Preop exam for L4-S1 microdecompression (redo) EXAM: LUMBAR SPINE - 2-3 VIEW COMPARISON:  CT lumbar  spine dated 08/25/2017. FINDINGS: Stable osseous alignment, with stable levoscoliosis. Stable degenerative changes, as previously described. No acute or suspicious osseous finding. Visualized paravertebral soft tissues are unremarkable. IMPRESSION: No acute findings.  Stable degenerative changes and scoliosis. Electronically Signed   By: Franki Cabot M.D.   On: 09/14/2017 09:44   Ct Lumbar Spine W Contrast  Result Date: 08/26/2017 CLINICAL DATA:  Left buttock and posterior left lower extremity pain extending into the third through fifth toes of the left foot. Some right toe involvement recently. EXAM: LUMBAR MYELOGRAM FLUOROSCOPY TIME:  Radiation Exposure Index (as provided by the fluoroscopic device): 420.66 microGray*m^2 Fluoroscopy Time (in minutes and seconds):  27 seconds PROCEDURE:  After thorough discussion of risks and benefits of the procedure including bleeding, infection, injury to nerves, blood vessels, adjacent structures as well as headache and CSF leak, written and oral informed consent was obtained. Consent was obtained by Dr. Logan Bores. Time out form was completed. Patient was positioned prone on the fluoroscopy table. Local anesthesia was provided with 1% lidocaine without epinephrine after prepped and draped in the usual sterile fashion. Puncture was performed at L3-4 using a 3 1/2 inch 22-gauge spinal needle via a right interlaminar approach. Using a single pass through the dura, the needle was placed within the thecal sac, with return of clear CSF. 15 mL of Isovue M-200 was injected into the thecal sac, with normal opacification of the nerve roots and cauda equina consistent with free flow within the subarachnoid space. I personally performed the lumbar puncture and administered the intrathecal contrast. I also personally supervised acquisition of the myelogram images. TECHNIQUE: Contiguous axial images were obtained through the Lumbar spine after the intrathecal infusion of infusion. Coronal and sagittal reconstructions were obtained of the axial image sets. COMPARISON:  Lumbar spine MRI 07/16/2017 from [facility] FINDINGS: LUMBAR MYELOGRAM FINDINGS: There is transitional lumbosacral anatomy without thoracic imaging available for comparison. There are 5 non rib-bearing lumbar type vertebrae followed by a transitional segment which will be considered a partially lumbarized S1. Minimal retrolisthesis of L3 on L4 and L4 on L5 slightly increas with extension. There is mild lumbar levoscoliosis with apex at L2. Ventral extradural defects are present from L3-4 to L5-S1. There is evidence of mild-to-moderate spinal stenosis at L4-5 with standing as well as right greater than left lateral recess stenosis without significant change with leftward and rightward bending. There is  also evidence of asymmetric right lateral recess stenosis at L3-4 and at L5-S1. Lateral recess stenosis at L3-4 and L4-5 appear slightly greater with standing than with prone positioning. CT LUMBAR MYELOGRAM FINDINGS: Vertebral alignment is unchanged from the recent prior MRI with mild upper lumbar levoscoliosis noted as well as minimal retrolisthesis of L4 on L5 greater than L3 on L4. No fracture or destructive osseous process is identified. The conus medullaris terminates at the upper L2 level. A 2 cm cyst is noted in the right kidney. A 6 mm stone is present in the lower pole of the left kidney. There is also a 3 mm stone in the proximal left ureter without hydronephrosis. There is abdominal aortic atherosclerosis without aneurysm. T12-L1: Minimal disc bulging and minimal right and moderate left facet arthrosis without stenosis, unchanged. L1-2: Minimal disc bulging without stenosis, unchanged. L2-3: Mild disc bulging and a bulky right lateral osteophyte result in minimal right lateral recess and minimal right neural foraminal stenosis without spinal stenosis, unchanged. L3-4: Circumferential disc bulging and mild facet hypertrophy result in mild spinal stenosis, mild  bilateral lateral recess stenosis, and mild-to-moderate right neural foraminal stenosis, unchanged. L4-5: Circumferential disc bulging and mild facet hypertrophy result in mild spinal stenosis, mild-to-moderate right and mild left lateral recess stenosis, and moderate right and mild left neural foraminal stenosis, unchanged. L5-S1: Prior left laminectomy. Circumferential disc bulging and moderate right and severe left facet hypertrophy result in mild spinal stenosis, moderate bilateral lateral recess stenosis, and moderate right and severe left neural foraminal stenosis. Potential impingement of the L5 and S1 nerve roots bilaterally, with the left L5 nerve root appearing compressed in the lateral aspect of the foramen. S1-2: Minimal disc bulging,  endplate spurring, and moderate facet arthrosis result in mild left greater than right lateral recess stenosis and moderate left neural foraminal stenosis without spinal stenosis, unchanged. IMPRESSION: 1. Transitional lumbosacral anatomy as above. 2. Right greater than left lateral recess stenosis at L4-5, worse with standing. Moderate right neural foraminal stenosis. 3. Prior left laminectomy at L5-S1. Moderate bilateral lateral recess and moderate right and severe left neural foraminal stenosis. Left L5 nerve root compression. 4. Mild spinal and lateral recess stenosis at L3-4. 5. Mild lateral recess and moderate left neural foraminal stenosis at S1-2. 6. Nonobstructing left renal and proximal left ureteral calculi. 7.  Aortic Atherosclerosis (ICD10-I70.0). Electronically Signed   By: Logan Bores M.D.   On: 08/26/2017 07:47   Dg Lumbar Spine 1 View  Result Date: 09/17/2017 CLINICAL DATA:  L4-5, L5-S1 decompression EXAM: LUMBAR SPINE - 1 VIEW COMPARISON:  09/14/2017 FINDINGS: Comparison is made to prior myelogram 08/25/2017 at which point there was considered transitional anatomy and lumbarization of S1. Based on this, the posterior localizing instruments on today's study are directed at L4-5 and L5-S1 levels. IMPRESSION: Transitional anatomy at the lumbosacral junction. Localization as above. Electronically Signed   By: Rolm Baptise M.D.   On: 09/17/2017 10:32   Dg Lumbar Spine 1 View  Result Date: 09/17/2017 CLINICAL DATA:  Decompression at L4-5 and L5-S1. EXAM: LUMBAR SPINE - 1 VIEW COMPARISON:  7:57 a.m., earlier today. FINDINGS: 0823 hours. Transitional anatomy again identified. Surgical devices project posterior to the S1-2 interspace, S1 vertebral body, and L4-5 interspace. IMPRESSION: Intraoperative localization. Report called to the operating room at 8:39 a.m. Electronically Signed   By: Abigail Miyamoto M.D.   On: 09/17/2017 08:34   Dg Myelography Lumbar Inj Lumbosacral  Result Date:  08/26/2017 CLINICAL DATA:  Left buttock and posterior left lower extremity pain extending into the third through fifth toes of the left foot. Some right toe involvement recently. EXAM: LUMBAR MYELOGRAM FLUOROSCOPY TIME:  Radiation Exposure Index (as provided by the fluoroscopic device): 420.66 microGray*m^2 Fluoroscopy Time (in minutes and seconds):  27 seconds PROCEDURE: After thorough discussion of risks and benefits of the procedure including bleeding, infection, injury to nerves, blood vessels, adjacent structures as well as headache and CSF leak, written and oral informed consent was obtained. Consent was obtained by Dr. Logan Bores. Time out form was completed. Patient was positioned prone on the fluoroscopy table. Local anesthesia was provided with 1% lidocaine without epinephrine after prepped and draped in the usual sterile fashion. Puncture was performed at L3-4 using a 3 1/2 inch 22-gauge spinal needle via a right interlaminar approach. Using a single pass through the dura, the needle was placed within the thecal sac, with return of clear CSF. 15 mL of Isovue M-200 was injected into the thecal sac, with normal opacification of the nerve roots and cauda equina consistent with free flow within the subarachnoid space.  I personally performed the lumbar puncture and administered the intrathecal contrast. I also personally supervised acquisition of the myelogram images. TECHNIQUE: Contiguous axial images were obtained through the Lumbar spine after the intrathecal infusion of infusion. Coronal and sagittal reconstructions were obtained of the axial image sets. COMPARISON:  Lumbar spine MRI 07/16/2017 from [facility] FINDINGS: LUMBAR MYELOGRAM FINDINGS: There is transitional lumbosacral anatomy without thoracic imaging available for comparison. There are 5 non rib-bearing lumbar type vertebrae followed by a transitional segment which will be considered a partially lumbarized S1. Minimal retrolisthesis of  L3 on L4 and L4 on L5 slightly increas with extension. There is mild lumbar levoscoliosis with apex at L2. Ventral extradural defects are present from L3-4 to L5-S1. There is evidence of mild-to-moderate spinal stenosis at L4-5 with standing as well as right greater than left lateral recess stenosis without significant change with leftward and rightward bending. There is also evidence of asymmetric right lateral recess stenosis at L3-4 and at L5-S1. Lateral recess stenosis at L3-4 and L4-5 appear slightly greater with standing than with prone positioning. CT LUMBAR MYELOGRAM FINDINGS: Vertebral alignment is unchanged from the recent prior MRI with mild upper lumbar levoscoliosis noted as well as minimal retrolisthesis of L4 on L5 greater than L3 on L4. No fracture or destructive osseous process is identified. The conus medullaris terminates at the upper L2 level. A 2 cm cyst is noted in the right kidney. A 6 mm stone is present in the lower pole of the left kidney. There is also a 3 mm stone in the proximal left ureter without hydronephrosis. There is abdominal aortic atherosclerosis without aneurysm. T12-L1: Minimal disc bulging and minimal right and moderate left facet arthrosis without stenosis, unchanged. L1-2: Minimal disc bulging without stenosis, unchanged. L2-3: Mild disc bulging and a bulky right lateral osteophyte result in minimal right lateral recess and minimal right neural foraminal stenosis without spinal stenosis, unchanged. L3-4: Circumferential disc bulging and mild facet hypertrophy result in mild spinal stenosis, mild bilateral lateral recess stenosis, and mild-to-moderate right neural foraminal stenosis, unchanged. L4-5: Circumferential disc bulging and mild facet hypertrophy result in mild spinal stenosis, mild-to-moderate right and mild left lateral recess stenosis, and moderate right and mild left neural foraminal stenosis, unchanged. L5-S1: Prior left laminectomy. Circumferential disc  bulging and moderate right and severe left facet hypertrophy result in mild spinal stenosis, moderate bilateral lateral recess stenosis, and moderate right and severe left neural foraminal stenosis. Potential impingement of the L5 and S1 nerve roots bilaterally, with the left L5 nerve root appearing compressed in the lateral aspect of the foramen. S1-2: Minimal disc bulging, endplate spurring, and moderate facet arthrosis result in mild left greater than right lateral recess stenosis and moderate left neural foraminal stenosis without spinal stenosis, unchanged. IMPRESSION: 1. Transitional lumbosacral anatomy as above. 2. Right greater than left lateral recess stenosis at L4-5, worse with standing. Moderate right neural foraminal stenosis. 3. Prior left laminectomy at L5-S1. Moderate bilateral lateral recess and moderate right and severe left neural foraminal stenosis. Left L5 nerve root compression. 4. Mild spinal and lateral recess stenosis at L3-4. 5. Mild lateral recess and moderate left neural foraminal stenosis at S1-2. 6. Nonobstructing left renal and proximal left ureteral calculi. 7.  Aortic Atherosclerosis (ICD10-I70.0). Electronically Signed   By: Logan Bores M.D.   On: 08/26/2017 07:47    EKG: Orders placed or performed in visit on 09/14/17  . EKG 12-Lead     Hospital Course: Patient was admitted to [facility] and taken to the OR and underwent the above state procedure without complications.  Patient tolerated the procedure well and was later transferred to the recovery room and then to the orthopaedic floor for postoperative care.  They were given PO and IV analgesics for pain control following their surgery.  They were given 24 hours of postoperative antibiotics.   PT was consulted postop to assist with mobility and transfers.  The patient was allowed to be WBAT with therapy and was taught back precautions. Discharge planning was consulted to help with postop disposition and  equipment needs.  Patient had a good night on the evening of surgery and started to get up OOB with therapy on day one. Patient was seen in rounds and was ready to go home on day one.  They were given discharge instructions and dressing directions.  They were instructed on when to follow up in the office with Dr. Tonita Cong.   Diet: Regular diet Activity:WBAT, Lspine precautions Follow-up:in 10-14 days Disposition - Home Discharged Condition: good   Discharge Instructions    Call MD / Call 911   Complete by:  As directed    If you experience chest pain or shortness of breath, CALL 911 and be transported to the hospital emergency room.  If you develope a fever above 101 F, pus (white drainage) or increased drainage or redness at the wound, or calf pain, call your surgeon's office.   Constipation Prevention   Complete by:  As directed    Drink plenty of fluids.  Prune juice may be helpful.  You may use a stool softener, such as Colace (over the counter) 100 mg twice a day.  Use MiraLax (over the counter) for constipation as needed.   Diet - low sodium heart healthy   Complete by:  As directed    Increase activity slowly as tolerated   Complete by:  As directed      Allergies as of 09/18/2017      Reactions   Codeine Nausea Only      Medication List    STOP taking these medications   ADVIL PM 200-25 MG Caps Generic drug:  Ibuprofen-diphenhydrAMINE HCl     TAKE these medications   amLODipine 5 MG tablet Commonly known as:  NORVASC Take 5 mg by mouth every evening.   docusate sodium 100 MG capsule Commonly known as:  COLACE Take 1 capsule (100 mg total) by mouth 2 (two) times daily as needed for mild constipation. What changed:    how much to take  when to take this  reasons to take this   lisinopril-hydrochlorothiazide 10-12.5 MG tablet Commonly known as:  PRINZIDE,ZESTORETIC Take 1 tablet by mouth daily.   oxyCODONE 5 MG immediate release tablet Commonly known as:   ROXICODONE Take 1-2 tablets (5-10 mg total) by mouth every 4 (four) hours as needed.   polyethylene glycol packet Commonly known as:  MIRALAX Take 17 g by mouth daily.   rosuvastatin 20 MG tablet Commonly known as:  CRESTOR Take 20 mg by mouth every evening.   tamsulosin 0.4 MG Caps capsule Commonly known as:  FLOMAX Take 0.4 mg by mouth daily.      Follow-up Information    Susa Day, MD Follow up in 2 week(s).   Specialty:  Orthopedic Surgery Contact information: [address] [place] [address] [phone]           Signed: Lacie Draft, PA-C Orthopaedic Surgery 09/18/2017, 7:58 AM

## 2017-09-22 ENCOUNTER — Other Ambulatory Visit (HOSPITAL_COMMUNITY)
Admission: RE | Admit: 2017-09-22 | Discharge: 2017-09-22 | Disposition: A | Discharge status: Home / Self Care | Payer: Medicare Other | Source: Other Acute Inpatient Hospital | Attending: Specialist | Admitting: Specialist

## 2017-09-22 DIAGNOSIS — M1711 Unilateral primary osteoarthritis, right knee: Secondary | ICD-10-CM | POA: Diagnosis present

## 2017-09-22 LAB — SYNOVIAL CELL COUNT + DIFF, W/ CRYSTALS
Crystals, Fluid: NONE SEEN
Lymphocytes-Synovial Fld: 3 % (ref 0–20)
MONOCYTE-MACROPHAGE-SYNOVIAL FLUID: 37 % — AB (ref 50–90)
NEUTROPHIL, SYNOVIAL: 60 % — AB (ref 0–25)
WBC, Synovial: 15860 /mm3 — ABNORMAL HIGH (ref 0–200)

## 2017-09-25 LAB — BODY FLUID CULTURE: CULTURE: NO GROWTH

## 2017-09-27 LAB — ANAEROBIC CULTURE

## 2019-04-22 IMAGING — CR DG LUMBAR SPINE 2-3V
3 series · 3 of 3 positions shown · non-contrast
Comparison: CT lumbar spine dated 08/25/2017.

CLINICAL DATA: Preop exam for L4-S1 microdecompression (redo)

EXAM:
LUMBAR SPINE - 2-3 VIEW

[w lumbar spine ap]
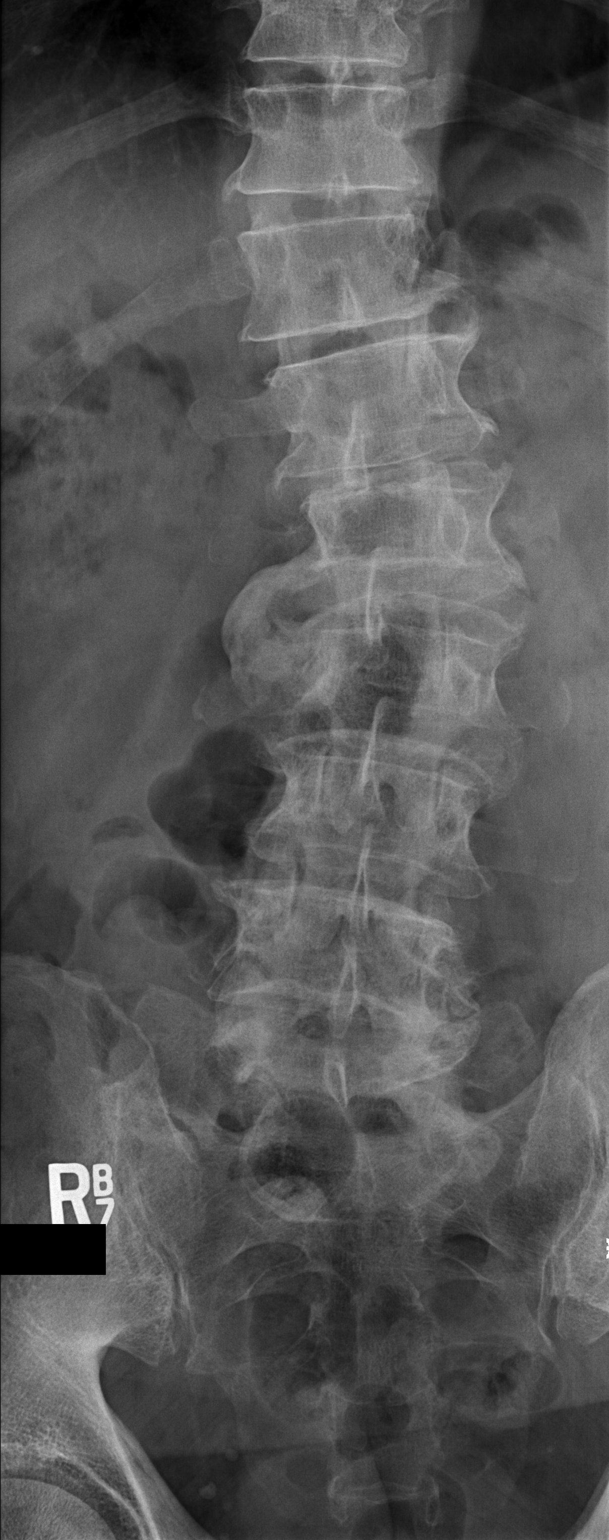

[w lumbar spine lat (1 of 2)]
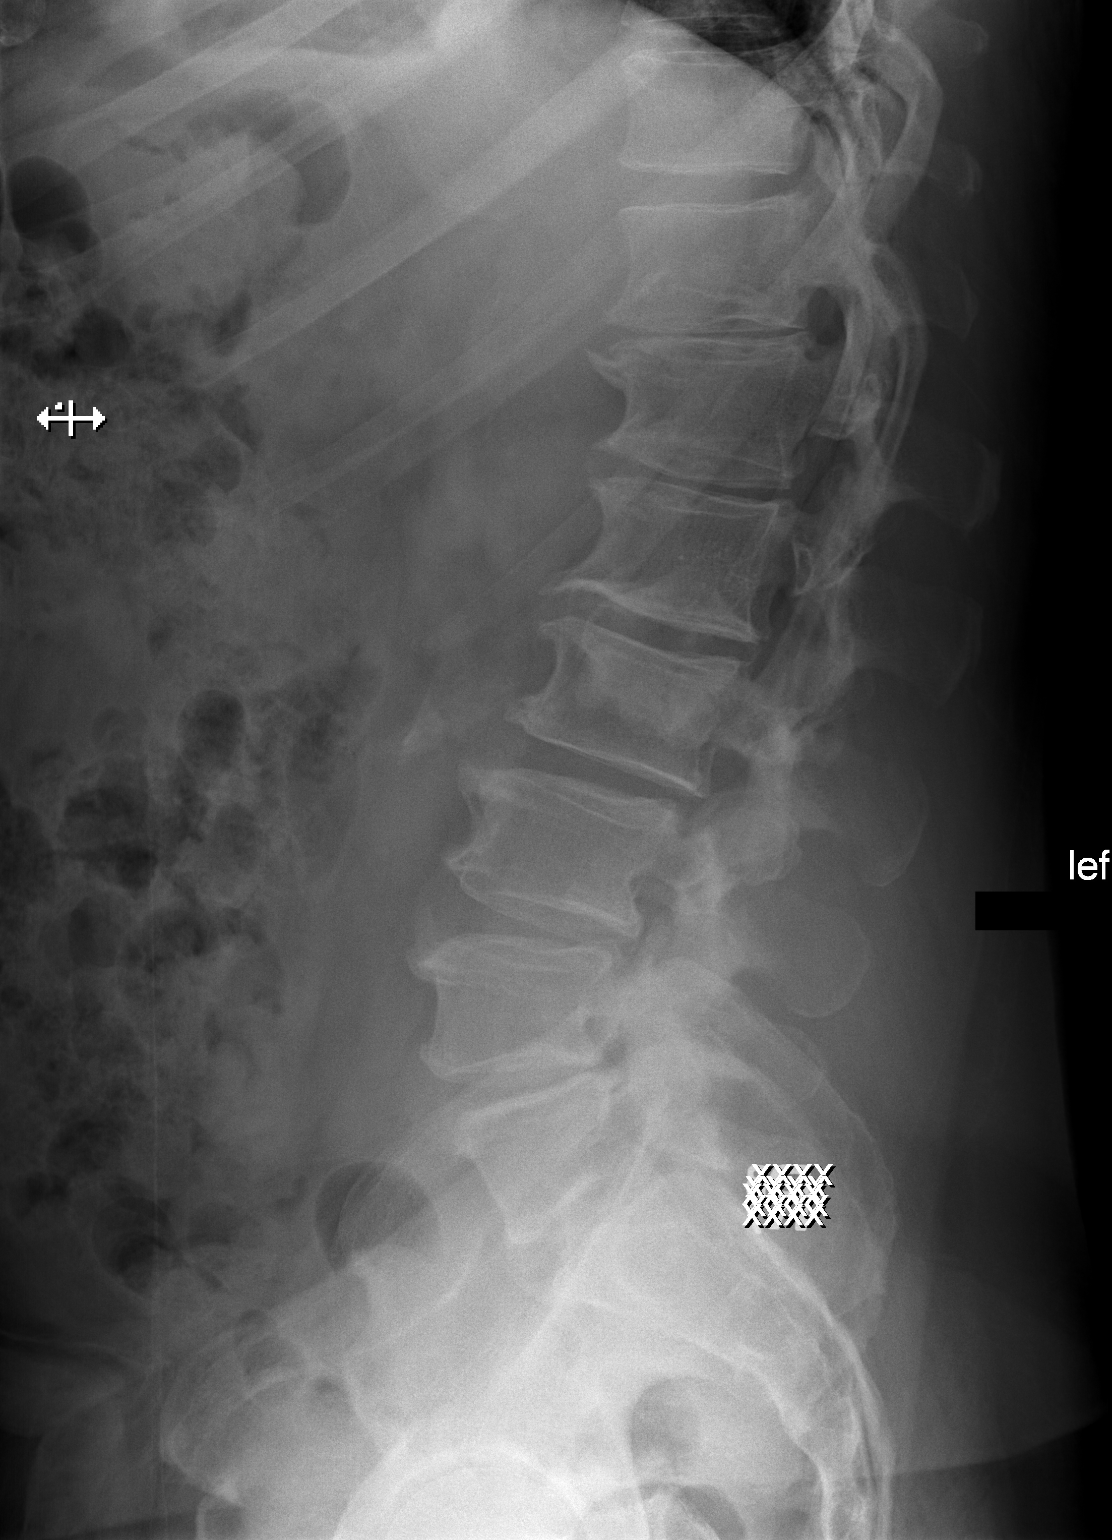

[w lumbar spine lat (2 of 2)]
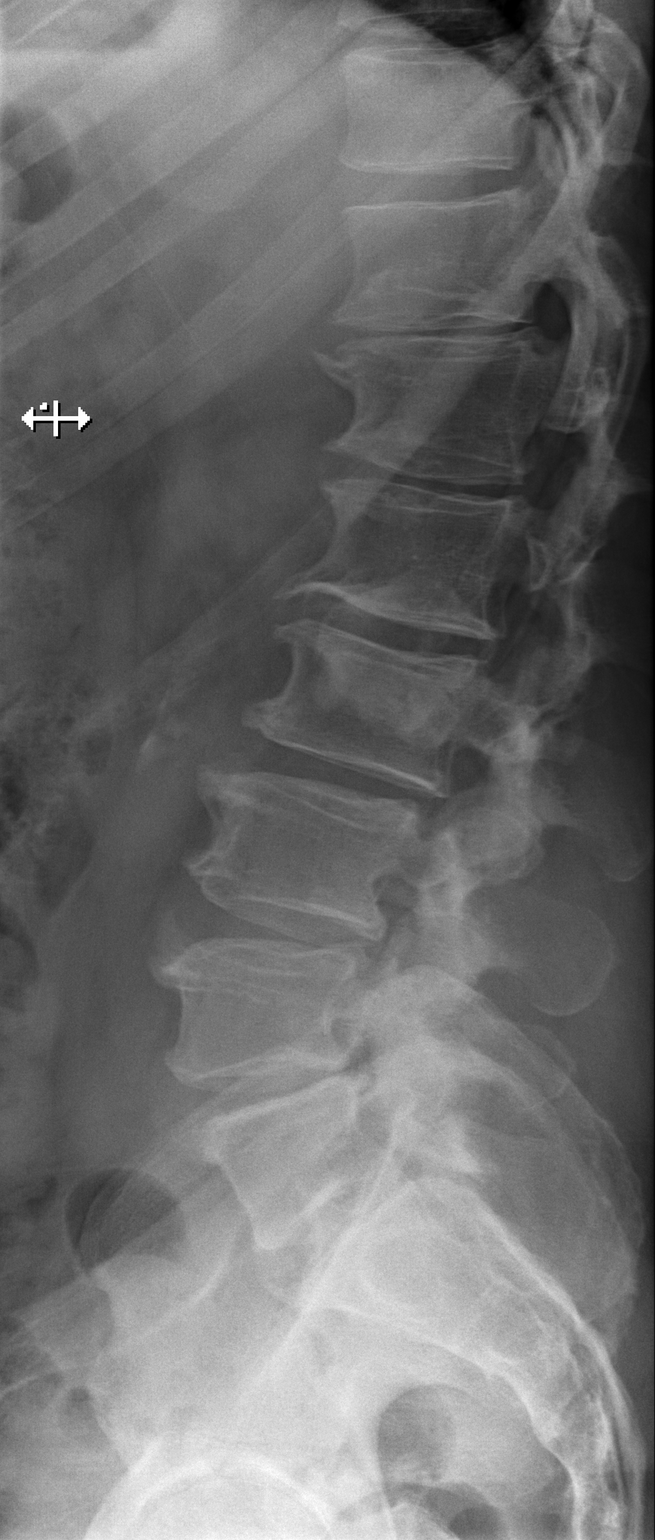

[3 of 3 positions shown; findings below may reference images not displayed]

FINDINGS: Stable osseous alignment, with stable levoscoliosis. Stable
degenerative changes, as previously described. No acute or
suspicious osseous finding. Visualized paravertebral soft tissues
are unremarkable.
IMPRESSION: No acute findings.  Stable degenerative changes and scoliosis.

## 2019-04-25 IMAGING — CR DG LUMBAR SPINE 1V
1 series · 1 of 1 positions shown · non-contrast
Comparison: 09/14/2017

CLINICAL DATA: L4-5, L5-S1 decompression

EXAM:
LUMBAR SPINE - 1 VIEW

[lateral]
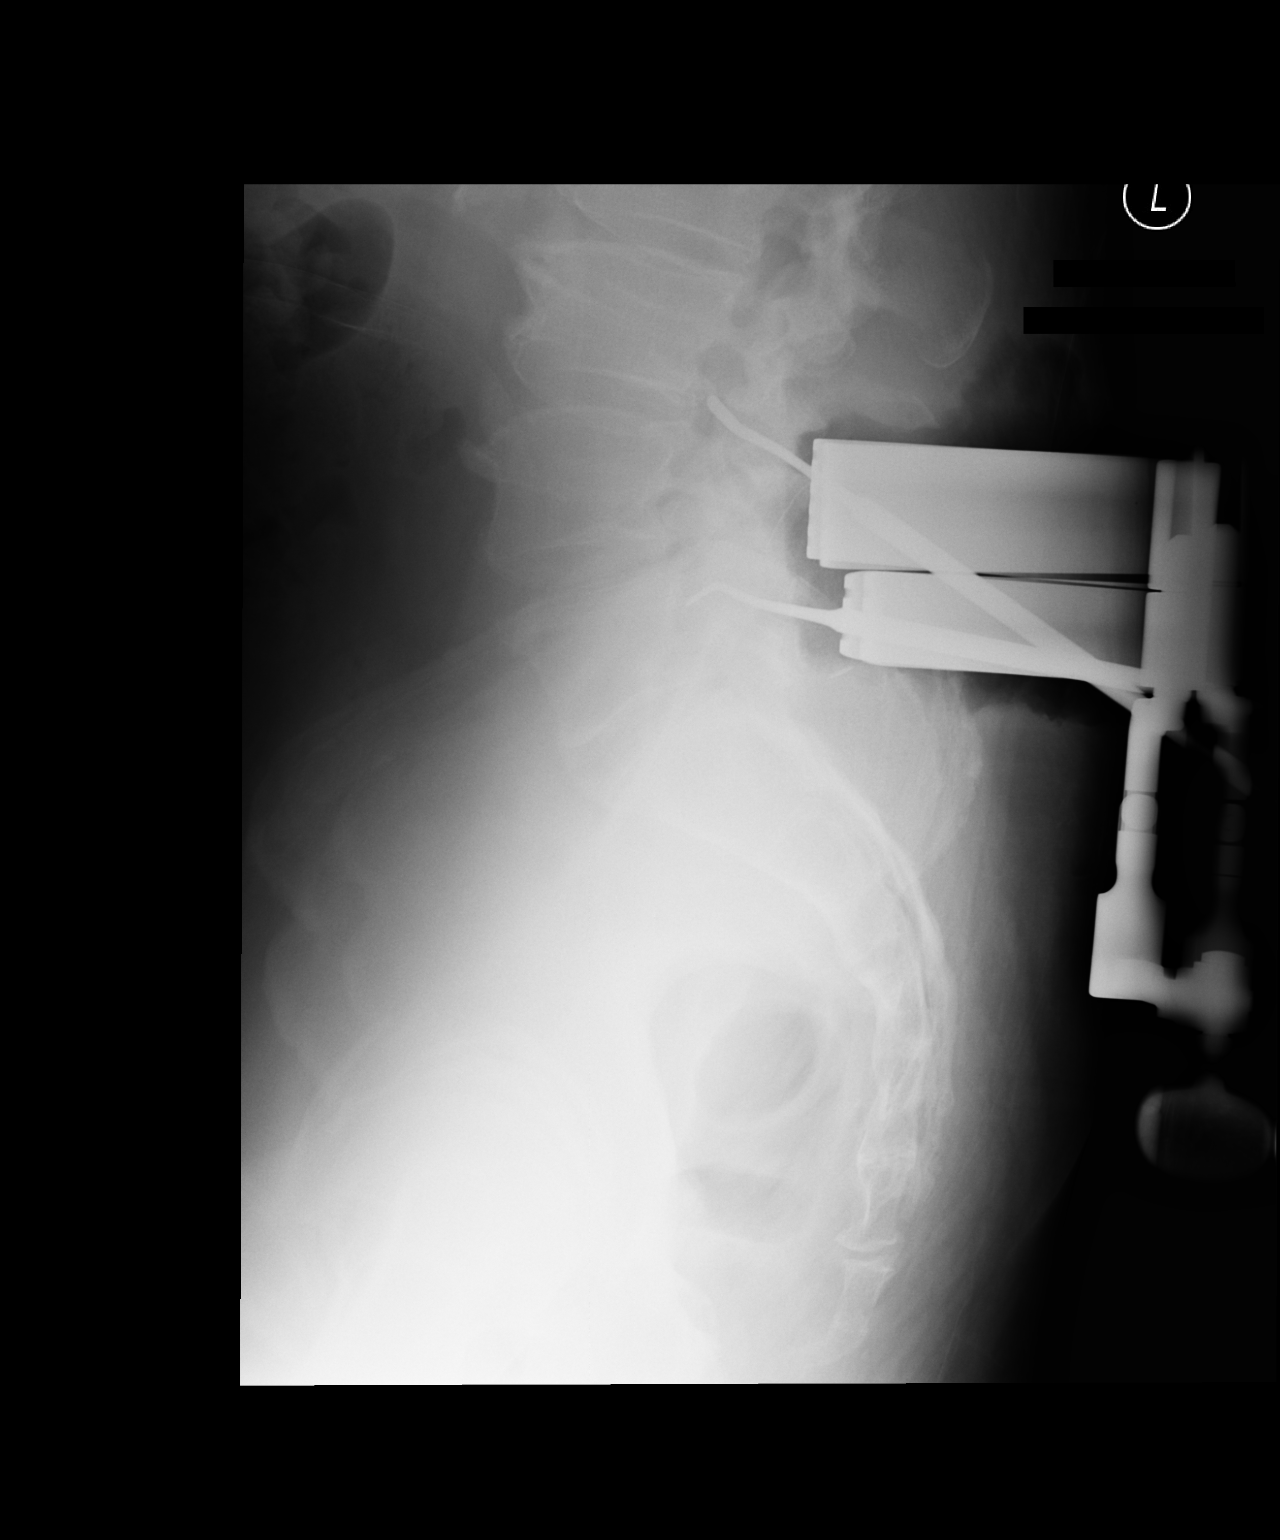

[1 of 1 positions shown; findings below may reference images not displayed]

FINDINGS: Comparison is made to prior myelogram 08/25/2017 at which point
there was considered transitional anatomy and lumbarization of S1.
Based on this, the posterior localizing instruments on today's study
are directed at L4-5 and L5-S1 levels.
IMPRESSION: Transitional anatomy at the lumbosacral junction. Localization as
above.

## 2022-01-31 ENCOUNTER — Ambulatory Visit: Payer: Self-pay | Admitting: Orthopedic Surgery

## 2022-02-09 NOTE — Progress Notes (Addendum)
COVID Vaccine received:  _0  No _1  Yes Date of any COVID positive Test in last 90 days:  None  PCP - Dairl Ponder, MD Cardiologist - none   Chest x-ray -  EKG -  09-14-2017  Will Repeat at PST appt Stress Test - none ECHO - none Cardiac Cath - none  PCR screen: _2  Ordered & Completed                      _3   No Order but Needs PROFEND                      _4   N/A for this surgery  Surgery Plan:  _5  Ambulatory                            _6  Outpatient in bed                            _7  Admit  Anesthesia:    _8  General  _9  Spinal                           _10   Choice _11   MAC  Pacemaker / ICD device _12  No _13  Yes        Device order form faxed _14  No    _15   Yes      Faxed to:  Spinal Cord Stimulator:_16  No _17  Yes      (Remind patient to bring remote DOS) Other Implants:   History of Sleep Apnea? _18  No _19  Yes   CPAP used?- _20  No _21  Yes    Does the patient monitor blood sugar? _22  No _23  Yes  _24  N/A  Blood Thinner / Instructions: none Aspirin Instructions: none  ERAS Protocol Ordered: _25  No  _26  Yes PRE-SURGERY _27  ENSURE  _28  G2  _29  No Drink Ordered  Patient is to be NPO after: 05:30  DOS  Comments: Patient lives in [place], [place], He signed medical release if needed in the future.   Activity level: Patient can climb a flight of stairs without difficulty; _30  No CP  _31  No SOB, but would have leg pain. Patient can perform ADLs without assistance.   Anesthesia review: HTN  Patient denies shortness of breath, fever, cough and chest pain at PAT appointment.  Patient verbalized understanding and agreement to the Pre-Surgical Instructions that were given to them at this PAT appointment. Patient was also educated of the need to review these PAT instructions again prior to his/her surgery.I reviewed the appropriate phone numbers to call if they have any and questions or concerns.

## 2022-02-09 NOTE — Patient Instructions (Signed)
SURGICAL WAITING ROOM VISITATION Patients having surgery or a procedure may have no more than 2 support people in the waiting area - these visitors may rotate in the visitor waiting room.   Children under the age of 73 must have an adult with them who is not the patient. If the patient needs to stay at the hospital during part of their recovery, the visitor guidelines for inpatient rooms apply.  PRE-OP VISITATION  Pre-op nurse will coordinate an appropriate time for 1 support person to accompany the patient in pre-op.  This support person may not rotate.  This visitor will be contacted when the time is appropriate for the visitor to come back in the pre-op area.  Please refer to the [facility] website for the visitor guidelines for Inpatients (after your surgery is over and you are in a regular room).  You are not required to quarantine at this time prior to your surgery. However, you must do this: Hand Hygiene often Do NOT share personal items Notify your provider if you are in close contact with someone who has COVID or you develop fever 100.4 or greater, new onset of sneezing, cough, sore throat, shortness of breath or body aches.  If you test positive for Covid or have been in contact with anyone that has tested positive in the last 10 days please notify you surgeon.    Your procedure is scheduled on:  Wednesday  February 19, 2022  Report to [facility] Main Entrance: [address] entrance where the [employer] is available.   Report to admitting at: 06:15  AM  +++++Call this number if you have any questions or problems the morning of surgery [phone]  Do not eat food after Midnight the night prior to your surgery/procedure.  After Midnight you may have the following liquids until 05:30 AM  DAY OF SURGERY  Clear Liquid Diet Water Black Coffee (sugar ok, NO MILK/CREAM OR CREAMERS)  Tea (sugar ok, NO MILK/CREAM OR CREAMERS) regular and decaf                              Plain Jell-O  with no fruit (NO RED)                                           Fruit ices (not with fruit pulp, NO RED)                                     Popsicles (NO RED)                                                                  Juice: apple, WHITE grape, WHITE cranberry Sports drinks like Gatorade or Powerade (NO RED)                    The day of surgery:  Drink ONE (1) Pre-Surgery Clear Ensure at  05:30  AM the morning of surgery. Drink in one sitting. Do not sip.  This drink was given to  you during your hospital pre-op appointment visit. Nothing else to drink after completing the Pre-Surgery Clear Ensure No candy, chewing gum or throat lozenges.    FOLLOW  ANY ADDITIONAL PRE OP INSTRUCTIONS YOU RECEIVED FROM YOUR SURGEON'S OFFICE!!!   Oral Hygiene is also important to reduce your risk of infection.        Remember - BRUSH YOUR TEETH THE MORNING OF SURGERY WITH YOUR REGULAR TOOTHPASTE  Take ONLY these medicines the morning of surgery with A SIP OF WATER: Amlodipine                   You may not have any metal on your body including  jewelry, and body piercing  Do not wear lotions, powders, cologne, or deodorant  Men may shave face and neck.  Contacts, Hearing Aids, dentures or bridgework may not be worn into surgery.   You may bring a small overnight bag with you on the day of surgery, only pack items that are not valuable . IS NOT RESPONSIBLE   FOR VALUABLES THAT ARE LOST OR STOLEN.   Do not bring your home medications to the hospital. The Pharmacy will dispense medications listed on your medication list to you during your admission in the Hospital.  Special Instructions: Bring a copy of your healthcare power of attorney and living will documents the day of surgery, if you wish to have them scanned into your [facility]- EPIC  Please read over the following fact sheets you were given: IF YOU HAVE QUESTIONS ABOUT YOUR PRE-OP  INSTRUCTIONS, PLEASE CALL [phone]  ([place])   [facility] - Preparing for Surgery Before surgery, you can play an important role.  Because skin is not sterile, your skin needs to be as free of germs as possible.  You can reduce the number of germs on your skin by washing with CHG (chlorahexidine gluconate) soap before surgery.  CHG is an antiseptic cleaner which kills germs and bonds with the skin to continue killing germs even after washing. Please DO NOT use if you have an allergy to CHG or antibacterial soaps.  If your skin becomes reddened/irritated stop using the CHG and inform your nurse when you arrive at Short Stay. Do not shave (including legs and underarms) for at least 48 hours prior to the first CHG shower.  You may shave your face/neck.  Please follow these instructions carefully:  1.  Shower with CHG Soap the night before surgery and the  morning of surgery.  2.  If you choose to wash your hair, wash your hair first as usual with your normal  shampoo.  3.  After you shampoo, rinse your hair and body thoroughly to remove the shampoo.                             4.  Use CHG as you would any other liquid soap.  You can apply chg directly to the skin and wash.  Gently with a scrungie or clean washcloth.  5.  Apply the CHG Soap to your body ONLY FROM THE NECK DOWN.   Do not use on face/ open                           Wound or open sores. Avoid contact with eyes, ears mouth and genitals (private parts).  Wash face,  Genitals (private parts) with your normal soap.             6.  Wash thoroughly, paying special attention to the area where your  surgery  will be performed.  7.  Thoroughly rinse your body with warm water from the neck down.  8.  DO NOT shower/wash with your normal soap after using and rinsing off the CHG Soap.            9.  Pat yourself dry with a clean towel.            10.  Wear clean pajamas.            11.  Place clean sheets on your bed the  night of your first shower and do not  sleep with pets.  ON THE DAY OF SURGERY : Do not apply any lotions/deodorants the morning of surgery.  Please wear clean clothes to the hospital/surgery center.    FAILURE TO FOLLOW THESE INSTRUCTIONS MAY RESULT IN THE CANCELLATION OF YOUR SURGERY  PATIENT SIGNATURE_________________________________  NURSE SIGNATURE__________________________________  ________________________________________________________________________         Adam Phenix    An incentive spirometer is a tool that can help keep your lungs clear and active. This tool measures how well you are filling your lungs with each breath. Taking long deep breaths may help reverse or decrease the chance of developing breathing (pulmonary) problems (especially infection) following: A long period of time when you are unable to move or be active. BEFORE THE PROCEDURE  If the spirometer includes an indicator to show your best effort, your nurse or respiratory therapist will set it to a desired goal. If possible, sit up straight or lean slightly forward. Try not to slouch. Hold the incentive spirometer in an upright position. INSTRUCTIONS FOR USE  Sit on the edge of your bed if possible, or sit up as far as you can in bed or on a chair. Hold the incentive spirometer in an upright position. Breathe out normally. Place the mouthpiece in your mouth and seal your lips tightly around it. Breathe in slowly and as deeply as possible, raising the piston or the ball toward the top of the column. Hold your breath for 3-5 seconds or for as long as possible. Allow the piston or ball to fall to the bottom of the column. Remove the mouthpiece from your mouth and breathe out normally. Rest for a few seconds and repeat Steps 1 through 7 at least 10 times every 1-2 hours when you are awake. Take your time and take a few normal breaths between deep breaths. The spirometer may include an indicator  to show your best effort. Use the indicator as a goal to work toward during each repetition. After each set of 10 deep breaths, practice coughing to be sure your lungs are clear. If you have an incision (the cut made at the time of surgery), support your incision when coughing by placing a pillow or rolled up towels firmly against it. Once you are able to get out of bed, walk around indoors and cough well. You may stop using the incentive spirometer when instructed by your caregiver.  RISKS AND COMPLICATIONS Take your time so you do not get dizzy or light-headed. If you are in pain, you may need to take or ask for pain medication before doing incentive spirometry. It is harder to take a deep breath if you are having pain. AFTER USE Rest and breathe  slowly and easily. It can be helpful to keep track of a log of your progress. Your caregiver can provide you with a simple table to help with this. If you are using the spirometer at home, follow these instructions: [place] IF:  You are having difficultly using the spirometer. You have trouble using the spirometer as often as instructed. Your pain medication is not giving enough relief while using the spirometer. You develop fever of 100.5 F (38.1 C) or higher.                                                                                                    SEEK IMMEDIATE MEDICAL CARE IF:  You cough up bloody sputum that had not been present before. You develop fever of 102 F (38.9 C) or greater. You develop worsening pain at or near the incision site. MAKE SURE YOU:  Understand these instructions. Will watch your condition. Will get help right away if you are not doing well or get worse. Document Released: 08/04/2006 Document Revised: 06/16/2011 Document Reviewed: 10/05/2006 [facility] Patient Information 2014 [place], [place].

## 2022-02-11 ENCOUNTER — Other Ambulatory Visit: Payer: Self-pay

## 2022-02-11 ENCOUNTER — Encounter (HOSPITAL_COMMUNITY): Payer: Self-pay

## 2022-02-11 ENCOUNTER — Encounter (HOSPITAL_COMMUNITY)
Admission: RE | Admit: 2022-02-11 | Discharge: 2022-02-11 | Disposition: A | Discharge status: Home / Self Care | Payer: Medicare Other | Source: Ambulatory Visit | Attending: Specialist | Admitting: Specialist

## 2022-02-11 VITALS — BP 144/66 | HR 64 | Temp 98.9°F | Resp 16 | Ht 77.0 in | Wt 262.0 lb

## 2022-02-11 DIAGNOSIS — I10 Essential (primary) hypertension: Secondary | ICD-10-CM | POA: Insufficient documentation

## 2022-02-11 DIAGNOSIS — I4510 Unspecified right bundle-branch block: Secondary | ICD-10-CM | POA: Insufficient documentation

## 2022-02-11 DIAGNOSIS — Z01818 Encounter for other preprocedural examination: Secondary | ICD-10-CM | POA: Diagnosis present

## 2022-02-11 HISTORY — DX: Pneumonia, unspecified organism: J18.9

## 2022-02-11 LAB — CBC
HCT: 42.4 % (ref 39.0–52.0)
Hemoglobin: 14.1 g/dL (ref 13.0–17.0)
MCH: 32.6 pg (ref 26.0–34.0)
MCHC: 33.3 g/dL (ref 30.0–36.0)
MCV: 98.1 fL (ref 80.0–100.0)
Platelets: 200 10*3/uL (ref 150–400)
RBC: 4.32 MIL/uL (ref 4.22–5.81)
RDW: 11.6 % (ref 11.5–15.5)
WBC: 7 10*3/uL (ref 4.0–10.5)
nRBC: 0 % (ref 0.0–0.2)

## 2022-02-11 LAB — BASIC METABOLIC PANEL
Anion gap: 10 (ref 5–15)
BUN: 16 mg/dL (ref 8–23)
CO2: 26 mmol/L (ref 22–32)
Calcium: 9.3 mg/dL (ref 8.9–10.3)
Chloride: 108 mmol/L (ref 98–111)
Creatinine, Ser: 0.88 mg/dL (ref 0.61–1.24)
GFR, Estimated: >60 mL/min (ref >60)
Glucose, Bld: 92 mg/dL (ref 70–99)
Potassium: 4.3 mmol/L (ref 3.5–5.1)
Sodium: 144 mmol/L (ref 135–145)

## 2022-02-11 LAB — SURGICAL PCR SCREEN
MRSA, PCR: NEGATIVE
Staphylococcus aureus: POSITIVE — AB

## 2022-02-11 NOTE — Progress Notes (Signed)
Patient's PCR screen is positive for ST[facility]. Appropriate notes have been placed on the patient's chart. This note has been routed to Dr. Susa Day and Lacie Draft, PA for review. The Patient's surgery , right TKA, is currently scheduled for:02-19-2022 at [facility].  Leota Jacobsen, BSN, CVRN-BC   Pre-Surgical Testing Nurse [place]  [phone]

## 2022-02-13 ENCOUNTER — Ambulatory Visit: Payer: Self-pay | Admitting: Orthopedic Surgery

## 2022-02-13 NOTE — H&P (View-Only) (Signed)
Glenn Mcdonald is an 71 y.o. male.   Chief Complaint: R knee pain HPI: Patient is in for his H&P. Patient is scheduled for a right total knee replacement by Dr. Shelle Iron on 02/19/22 at [facility].  Dr. Shelle Iron and the patient mutually agreed to proceed with a total knee replacement. Risks and benefits of the procedure were discussed including stiffness, suboptimal range of motion, persistent pain, infection requiring removal of prosthesis and reinsertion, need for prophylactic antibiotics in the future, for example, dental procedures, possible need for manipulation, revision in the future and also anesthetic complications including DVT, PE, etc. We discussed the perioperative course, time in the hospital, postoperative recovery and the need for elevation to control swelling. We also discussed the predicted range of motion and the probability that squatting and kneeling would be unobtainable in the future. In addition, postoperative anticoagulation was discussed. We have obtained preoperative medical clearance as necessary. Provided illustrated handout and discussed it in detail. They will enroll in the total joint replacement educational forum at the hospital.  He was cleared by his PCP. He has an allergy to codeine. Does experience some nausea and vomiting with most narcotics, has tolerated Nucynta previously. Has equipment at home from his wife undergoing total knee replacement previously. Also has a prior history of back surgery.  Past Medical History:  Diagnosis Date   Arthritis    Asthma    out grew    History of kidney stones    Hypertension    Pneumonia     Past Surgical History:  Procedure Laterality Date   BACK SURGERY     CARPAL TUNNEL RELEASE Left    COLONOSCOPY     JOINT REPLACEMENT Bilateral    LUMBAR LAMINECTOMY/DECOMPRESSION MICRODISCECTOMY N/A 09/17/2017   Procedure: Revision Microlumbar Decompression Lumbar Four-Five, Lumbar Five-Sacral One Bilaterally;  Surgeon: Jene Every, MD;  Location: [facility] OR;  Service: Orthopedics;  Laterality: N/A;  Revision Microlumbar Decompression Lumbar Four-Five, Lumbar Five-Sacral One Bilaterally    SHOULDER ARTHROSCOPY Right    times 2   SINUS SURGERY WITH INSTATRAK      Family History  Problem Relation Age of Onset   Prostate cancer Father    Social History:  reports that he has never smoked. He has never used smokeless tobacco. He reports current alcohol use. He reports that he does not use drugs.  Allergies:  Allergies  Allergen Reactions   Codeine Nausea Only    Drunk/ out of it    Current meds: AdviL amLODIPine 5 mg tablet aspirin Dulcolax Stool Softener (docusate) 100 mg capsule losartan 50 mg tablet rosuvastatin 20 mg tablet  Review of Systems  Constitutional: Negative.   HENT: Negative.    Eyes: Negative.   Respiratory: Negative.    Cardiovascular: Negative.   Gastrointestinal: Negative.   Endocrine: Negative.   Genitourinary: Negative.   Musculoskeletal:  Positive for arthralgias, gait problem, joint swelling and myalgias.  Skin: Negative.   Psychiatric/Behavioral: Negative.      There were no vitals taken for this visit. Physical Exam Constitutional:      Appearance: Normal appearance.  HENT:     Head: Normocephalic and atraumatic.     Right Ear: External ear normal.     Left Ear: External ear normal.     Nose: Nose normal.     Mouth/Throat:     Pharynx: Oropharynx is clear.  Eyes:     Conjunctiva/sclera: Conjunctivae normal.  Cardiovascular:     Rate and Rhythm: Normal rate and  regular rhythm.     Pulses: Normal pulses.     Heart sounds: Normal heart sounds.  Pulmonary:     Effort: Pulmonary effort is normal.     Breath sounds: Normal breath sounds.  Abdominal:     General: Bowel sounds are normal.  Musculoskeletal:     Cervical back: Normal range of motion and neck supple.     Comments: Patient is awake, alert, oriented 3. Well-nourished and well-developed. Antalgic gait,  no assistive devices.  On examination of the right knee, tender on palpation of the medial joint line. Nontender lateral joint line, patellar tendon, quadriceps tendon, patella, peroneal nerve and popliteal space. No calf pain or sign of DVT. No pain or laxity with varus or valgus stress. No instability noted. Negative McMurray's. Trace effusion. Range of motion approximately 0 to 115 degrees, reports tightness at both extremes. Positive patellofemoral crepitus. No patellofemoral pain on compression. Sensation intact distally.  Neurological:     Mental Status: He is alert.     Prior standing knee x-rays reviewed. Progressive degenerative changes since prior x-rays, now bone-on-bone medially and patellofemoral he on the right knee.  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 Shelle Iron on November 15. We again discussed the procedure itself as well as risks, complications and alternatives, including but not limited to DVT, PE, infx, bleeding, failure of procedure, need for secondary procedure including manipulation, nerve injury, ongoing pain/symptoms, anesthesia risk, even stroke or death. Also discussed typical post-op protocols, activity restrictions, need for PT, flexion/extension exercises, time out of work. Discussed need for DVT ppx post-op per protocol. Discussed dental ppx and infx prevention. Also discussed limitations post-operatively such as kneeling and squatting. All questions were answered. Patient desires to proceed with surgery as scheduled.  Will hold supplements, ASA and NSAIDs accordingly. Will remain NPO after midnight the night before surgery. Will present to [facility] for pre-op testing. Plan ASA for DVT ppx post-op. Plan oxycodone with Zofran,, Colace, Miralax. Plan home with HHPT post-op with family  members at home for assistance. Will follow up 10-14 days post-op for staple removal and xrays.  Plan Right total knee replacement  Dorothy Spark, PA-C for Dr Shelle Iron 02/13/2022, 12:53 PM

## 2022-02-13 NOTE — H&P (Signed)
Glenn Mcdonald is an 71 y.o. male.   Chief Complaint: R knee pain HPI: Patient is in for his H&P. Patient is scheduled for a right total knee replacement by Dr. Shelle Iron on 02/19/22 at [facility].  Dr. Shelle Iron and the patient mutually agreed to proceed with a total knee replacement. Risks and benefits of the procedure were discussed including stiffness, suboptimal range of motion, persistent pain, infection requiring removal of prosthesis and reinsertion, need for prophylactic antibiotics in the future, for example, dental procedures, possible need for manipulation, revision in the future and also anesthetic complications including DVT, PE, etc. We discussed the perioperative course, time in the hospital, postoperative recovery and the need for elevation to control swelling. We also discussed the predicted range of motion and the probability that squatting and kneeling would be unobtainable in the future. In addition, postoperative anticoagulation was discussed. We have obtained preoperative medical clearance as necessary. Provided illustrated handout and discussed it in detail. They will enroll in the total joint replacement educational forum at the hospital.  He was cleared by his PCP. He has an allergy to codeine. Does experience some nausea and vomiting with most narcotics, has tolerated Nucynta previously. Has equipment at home from his wife undergoing total knee replacement previously. Also has a prior history of back surgery.  Past Medical History:  Diagnosis Date   Arthritis    Asthma    out grew    History of kidney stones    Hypertension    Pneumonia     Past Surgical History:  Procedure Laterality Date   BACK SURGERY     CARPAL TUNNEL RELEASE Left    COLONOSCOPY     JOINT REPLACEMENT Bilateral    LUMBAR LAMINECTOMY/DECOMPRESSION MICRODISCECTOMY N/A 09/17/2017   Procedure: Revision Microlumbar Decompression Lumbar Four-Five, Lumbar Five-Sacral One Bilaterally;  Surgeon: Jene Every, MD;  Location: [facility] OR;  Service: Orthopedics;  Laterality: N/A;  Revision Microlumbar Decompression Lumbar Four-Five, Lumbar Five-Sacral One Bilaterally    SHOULDER ARTHROSCOPY Right    times 2   SINUS SURGERY WITH INSTATRAK      Family History  Problem Relation Age of Onset   Prostate cancer Father    Social History:  reports that he has never smoked. He has never used smokeless tobacco. He reports current alcohol use. He reports that he does not use drugs.  Allergies:  Allergies  Allergen Reactions   Codeine Nausea Only    Drunk/ out of it    Current meds: AdviL amLODIPine 5 mg tablet aspirin Dulcolax Stool Softener (docusate) 100 mg capsule losartan 50 mg tablet rosuvastatin 20 mg tablet  Review of Systems  Constitutional: Negative.   HENT: Negative.    Eyes: Negative.   Respiratory: Negative.    Cardiovascular: Negative.   Gastrointestinal: Negative.   Endocrine: Negative.   Genitourinary: Negative.   Musculoskeletal:  Positive for arthralgias, gait problem, joint swelling and myalgias.  Skin: Negative.   Psychiatric/Behavioral: Negative.      There were no vitals taken for this visit. Physical Exam Constitutional:      Appearance: Normal appearance.  HENT:     Head: Normocephalic and atraumatic.     Right Ear: External ear normal.     Left Ear: External ear normal.     Nose: Nose normal.     Mouth/Throat:     Pharynx: Oropharynx is clear.  Eyes:     Conjunctiva/sclera: Conjunctivae normal.  Cardiovascular:     Rate and Rhythm: Normal rate and  regular rhythm.     Pulses: Normal pulses.     Heart sounds: Normal heart sounds.  Pulmonary:     Effort: Pulmonary effort is normal.     Breath sounds: Normal breath sounds.  Abdominal:     General: Bowel sounds are normal.  Musculoskeletal:     Cervical back: Normal range of motion and neck supple.     Comments: Patient is awake, alert, oriented 3. Well-nourished and well-developed. Antalgic gait,  no assistive devices.  On examination of the right knee, tender on palpation of the medial joint line. Nontender lateral joint line, patellar tendon, quadriceps tendon, patella, peroneal nerve and popliteal space. No calf pain or sign of DVT. No pain or laxity with varus or valgus stress. No instability noted. Negative McMurray's. Trace effusion. Range of motion approximately 0 to 115 degrees, reports tightness at both extremes. Positive patellofemoral crepitus. No patellofemoral pain on compression. Sensation intact distally.  Neurological:     Mental Status: He is alert.     Prior standing knee x-rays reviewed. Progressive degenerative changes since prior x-rays, now bone-on-bone medially and patellofemoral he on the right knee.  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 Shelle Iron on November 15. We again discussed the procedure itself as well as risks, complications and alternatives, including but not limited to DVT, PE, infx, bleeding, failure of procedure, need for secondary procedure including manipulation, nerve injury, ongoing pain/symptoms, anesthesia risk, even stroke or death. Also discussed typical post-op protocols, activity restrictions, need for PT, flexion/extension exercises, time out of work. Discussed need for DVT ppx post-op per protocol. Discussed dental ppx and infx prevention. Also discussed limitations post-operatively such as kneeling and squatting. All questions were answered. Patient desires to proceed with surgery as scheduled.  Will hold supplements, ASA and NSAIDs accordingly. Will remain NPO after midnight the night before surgery. Will present to [facility] for pre-op testing. Plan ASA for DVT ppx post-op. Plan oxycodone with Zofran,, Colace, Miralax. Plan home with HHPT post-op with family  members at home for assistance. Will follow up 10-14 days post-op for staple removal and xrays.  Plan Right total knee replacement  Dorothy Spark, PA-C for Dr Shelle Iron 02/13/2022, 12:53 PM

## 2022-02-19 ENCOUNTER — Encounter (HOSPITAL_COMMUNITY)
Admission: RE | Disposition: A | Discharge status: Home / Self Care | Payer: Self-pay | Source: Ambulatory Visit | Attending: Specialist

## 2022-02-19 ENCOUNTER — Ambulatory Visit (HOSPITAL_COMMUNITY): Payer: Medicare Other

## 2022-02-19 ENCOUNTER — Ambulatory Visit (HOSPITAL_COMMUNITY): Payer: Medicare Other | Admitting: Certified Registered Nurse Anesthetist

## 2022-02-19 ENCOUNTER — Encounter (HOSPITAL_COMMUNITY): Payer: Self-pay | Admitting: Specialist

## 2022-02-19 ENCOUNTER — Ambulatory Visit (HOSPITAL_BASED_OUTPATIENT_CLINIC_OR_DEPARTMENT_OTHER): Payer: Medicare Other | Admitting: Certified Registered Nurse Anesthetist

## 2022-02-19 ENCOUNTER — Other Ambulatory Visit: Payer: Self-pay

## 2022-02-19 ENCOUNTER — Ambulatory Visit (HOSPITAL_COMMUNITY)
Admission: RE | Admit: 2022-02-19 | Discharge: 2022-02-20 | Disposition: A | Discharge status: Home / Self Care | Payer: Medicare Other | Source: Ambulatory Visit | Attending: Specialist | Admitting: Specialist

## 2022-02-19 DIAGNOSIS — M21161 Varus deformity, not elsewhere classified, right knee: Secondary | ICD-10-CM

## 2022-02-19 DIAGNOSIS — I10 Essential (primary) hypertension: Secondary | ICD-10-CM | POA: Diagnosis not present

## 2022-02-19 DIAGNOSIS — J45909 Unspecified asthma, uncomplicated: Secondary | ICD-10-CM | POA: Insufficient documentation

## 2022-02-19 DIAGNOSIS — M1711 Unilateral primary osteoarthritis, right knee: Secondary | ICD-10-CM

## 2022-02-19 DIAGNOSIS — Z885 Allergy status to narcotic agent status: Secondary | ICD-10-CM | POA: Diagnosis not present

## 2022-02-19 DIAGNOSIS — M25761 Osteophyte, right knee: Secondary | ICD-10-CM | POA: Insufficient documentation

## 2022-02-19 HISTORY — PX: TOTAL KNEE ARTHROPLASTY: SHX125

## 2022-02-19 SURGERY — ARTHROPLASTY, KNEE, TOTAL
Anesthesia: Monitor Anesthesia Care | Site: Knee | Laterality: Right

## 2022-02-19 MED ORDER — METHOCARBAMOL 1000 MG/10ML IJ SOLN: 500.0000 mg | Freq: Four times a day (QID) | INTRAVENOUS | Status: DC | PRN

## 2022-02-19 MED ORDER — FENTANYL CITRATE (PF) 100 MCG/2ML IJ SOLN
INTRAMUSCULAR | Status: DC | PRN
  Administered 2022-02-19: 25 ug via INTRAVENOUS

## 2022-02-19 MED ORDER — BISACODYL 5 MG PO TBEC: 5.0000 mg | DELAYED_RELEASE_TABLET | Freq: Every day | ORAL | Status: DC | PRN

## 2022-02-19 MED ORDER — METOCLOPRAMIDE HCL 5 MG PO TABS: 5.0000 mg | ORAL_TABLET | Freq: Three times a day (TID) | ORAL | Status: DC | PRN

## 2022-02-19 MED ORDER — FENTANYL CITRATE PF 50 MCG/ML IJ SOSY: 25.0000 ug | PREFILLED_SYRINGE | INTRAMUSCULAR | Status: DC | PRN

## 2022-02-19 MED ORDER — LACTATED RINGERS IV SOLN: INTRAVENOUS | Status: DC

## 2022-02-19 MED ORDER — BUPIVACAINE IN DEXTROSE 0.75-8.25 % IT SOLN
INTRATHECAL | Status: DC | PRN
  Administered 2022-02-19: 1.8 mL via INTRATHECAL

## 2022-02-19 MED ORDER — CELECOXIB 200 MG PO CAPS
200.0000 mg | ORAL_CAPSULE | Freq: Every day | ORAL | Status: DC
  Administered 2022-02-20: 200 mg via ORAL
  Filled 2022-02-19: qty 1

## 2022-02-19 MED ORDER — MIDAZOLAM HCL 2 MG/2ML IJ SOLN
INTRAMUSCULAR | Status: AC
  Filled 2022-02-19: qty 2

## 2022-02-19 MED ORDER — MIDAZOLAM HCL 5 MG/5ML IJ SOLN
INTRAMUSCULAR | Status: DC | PRN
  Administered 2022-02-19 (×2): 1 mg via INTRAVENOUS

## 2022-02-19 MED ORDER — PHENOL 1.4 % MT LIQD: 1.0000 | OROMUCOSAL | Status: DC | PRN

## 2022-02-19 MED ORDER — PHENYLEPHRINE HCL-NACL 20-0.9 MG/250ML-% IV SOLN
INTRAVENOUS | Status: AC
  Filled 2022-02-19: qty 250

## 2022-02-19 MED ORDER — 0.9 % SODIUM CHLORIDE (POUR BTL) OPTIME
TOPICAL | Status: DC | PRN
  Administered 2022-02-19: 1000 mL

## 2022-02-19 MED ORDER — HYDROMORPHONE HCL 2 MG PO TABS
2.0000 mg | ORAL_TABLET | ORAL | Status: DC | PRN
  Administered 2022-02-19: 2 mg via ORAL
  Filled 2022-02-19: qty 1

## 2022-02-19 MED ORDER — LIDOCAINE-EPINEPHRINE (PF) 1 %-1:200000 IJ SOLN
INTRAMUSCULAR | Status: DC | PRN
  Administered 2022-02-19: 20 mL

## 2022-02-19 MED ORDER — COLCHICINE 0.6 MG PO TABS
0.6000 mg | ORAL_TABLET | Freq: Every day | ORAL | Status: DC
  Administered 2022-02-19 – 2022-02-20 (×2): 0.6 mg via ORAL
  Filled 2022-02-19 (×2): qty 1

## 2022-02-19 MED ORDER — ASPIRIN 81 MG PO TBEC: 81.0000 mg | DELAYED_RELEASE_TABLET | Freq: Two times a day (BID) | ORAL | 1 refills | Status: AC

## 2022-02-19 MED ORDER — DEXAMETHASONE SODIUM PHOSPHATE 10 MG/ML IJ SOLN
INTRAMUSCULAR | Status: DC | PRN
  Administered 2022-02-19: 5 mg via INTRAVENOUS

## 2022-02-19 MED ORDER — DOCUSATE SODIUM 100 MG PO CAPS
100.0000 mg | ORAL_CAPSULE | Freq: Two times a day (BID) | ORAL | Status: DC
  Administered 2022-02-19 – 2022-02-20 (×2): 100 mg via ORAL
  Filled 2022-02-19 (×2): qty 1

## 2022-02-19 MED ORDER — FENTANYL CITRATE (PF) 100 MCG/2ML IJ SOLN
INTRAMUSCULAR | Status: AC
  Filled 2022-02-19: qty 2

## 2022-02-19 MED ORDER — PROPOFOL 1000 MG/100ML IV EMUL
INTRAVENOUS | Status: AC
  Filled 2022-02-19: qty 100

## 2022-02-19 MED ORDER — PROPOFOL 10 MG/ML IV BOLUS
INTRAVENOUS | Status: DC | PRN
  Administered 2022-02-19 (×2): 20 mg via INTRAVENOUS
  Administered 2022-02-19: 10 mg via INTRAVENOUS
  Administered 2022-02-19: 20 mg via INTRAVENOUS

## 2022-02-19 MED ORDER — ALUM & MAG HYDROXIDE-SIMETH 200-200-20 MG/5ML PO SUSP: 30.0000 mL | ORAL | Status: DC | PRN

## 2022-02-19 MED ORDER — ORAL CARE MOUTH RINSE: 15.0000 mL | Freq: Once | OROMUCOSAL | Status: AC

## 2022-02-19 MED ORDER — POLYETHYLENE GLYCOL 3350 17 G PO PACK: 17.0000 g | PACK | Freq: Every day | ORAL | 0 refills | Status: AC

## 2022-02-19 MED ORDER — METHOCARBAMOL 500 MG PO TABS: 750.0000 mg | ORAL_TABLET | Freq: Three times a day (TID) | ORAL | Status: AC | PRN

## 2022-02-19 MED ORDER — OXYCODONE HCL 10 MG PO TABS: 10.0000 mg | ORAL_TABLET | ORAL | 0 refills | Status: AC | PRN

## 2022-02-19 MED ORDER — METHOCARBAMOL 500 MG PO TABS
500.0000 mg | ORAL_TABLET | Freq: Four times a day (QID) | ORAL | Status: DC | PRN
  Administered 2022-02-19 – 2022-02-20 (×3): 500 mg via ORAL
  Filled 2022-02-19 (×3): qty 1

## 2022-02-19 MED ORDER — BUPIVACAINE LIPOSOME 1.3 % IJ SUSP
INTRAMUSCULAR | Status: DC | PRN
  Administered 2022-02-19: 20 mL

## 2022-02-19 MED ORDER — BUPIVACAINE LIPOSOME 1.3 % IJ SUSP
INTRAMUSCULAR | Status: AC
  Filled 2022-02-19: qty 20

## 2022-02-19 MED ORDER — KCL IN DEXTROSE-NACL 20-5-0.45 MEQ/L-%-% IV SOLN
INTRAVENOUS | Status: DC
  Filled 2022-02-19 (×3): qty 1000

## 2022-02-19 MED ORDER — ROPIVACAINE HCL 5 MG/ML IJ SOLN
INTRAMUSCULAR | Status: DC | PRN
  Administered 2022-02-19: 20 mL via PERINEURAL

## 2022-02-19 MED ORDER — LIDOCAINE-EPINEPHRINE (PF) 1 %-1:200000 IJ SOLN
INTRAMUSCULAR | Status: AC
  Filled 2022-02-19: qty 30

## 2022-02-19 MED ORDER — CEFAZOLIN SODIUM-DEXTROSE 2-4 GM/100ML-% IV SOLN
2.0000 g | INTRAVENOUS | Status: AC
  Administered 2022-02-19: 2 g via INTRAVENOUS
  Filled 2022-02-19: qty 100

## 2022-02-19 MED ORDER — OXYCODONE HCL 5 MG PO TABS: 5.0000 mg | ORAL_TABLET | ORAL | Status: DC | PRN

## 2022-02-19 MED ORDER — METOCLOPRAMIDE HCL 5 MG/ML IJ SOLN: 5.0000 mg | Freq: Three times a day (TID) | INTRAMUSCULAR | Status: DC | PRN

## 2022-02-19 MED ORDER — ACETAMINOPHEN 10 MG/ML IV SOLN
1000.0000 mg | INTRAVENOUS | Status: AC
  Administered 2022-02-19: 1000 mg via INTRAVENOUS
  Filled 2022-02-19: qty 100

## 2022-02-19 MED ORDER — LOSARTAN POTASSIUM 50 MG PO TABS
100.0000 mg | ORAL_TABLET | Freq: Every day | ORAL | Status: DC
  Administered 2022-02-19 – 2022-02-20 (×2): 100 mg via ORAL
  Filled 2022-02-19 (×2): qty 2

## 2022-02-19 MED ORDER — ONDANSETRON HCL 4 MG/2ML IJ SOLN: 4.0000 mg | Freq: Four times a day (QID) | INTRAMUSCULAR | Status: DC | PRN

## 2022-02-19 MED ORDER — COLCHICINE 0.3 MG HALF TABLET: 0.3000 mg | ORAL_TABLET | Freq: Every day | ORAL | Status: DC

## 2022-02-19 MED ORDER — MENTHOL 3 MG MT LOZG: 1.0000 | LOZENGE | OROMUCOSAL | Status: DC | PRN

## 2022-02-19 MED ORDER — ONDANSETRON HCL 4 MG PO TABS
4.0000 mg | ORAL_TABLET | Freq: Four times a day (QID) | ORAL | Status: DC | PRN
  Filled 2022-02-19: qty 1

## 2022-02-19 MED ORDER — CEFAZOLIN SODIUM-DEXTROSE 2-4 GM/100ML-% IV SOLN
2.0000 g | Freq: Four times a day (QID) | INTRAVENOUS | Status: AC
  Administered 2022-02-19 (×2): 2 g via INTRAVENOUS
  Filled 2022-02-19 (×2): qty 100

## 2022-02-19 MED ORDER — PROPOFOL 500 MG/50ML IV EMUL
INTRAVENOUS | Status: DC | PRN
  Administered 2022-02-19: 75 ug/kg/min via INTRAVENOUS

## 2022-02-19 MED ORDER — RISAQUAD PO CAPS
1.0000 | ORAL_CAPSULE | Freq: Every day | ORAL | Status: DC
  Administered 2022-02-19 – 2022-02-20 (×2): 1 via ORAL
  Filled 2022-02-19 (×2): qty 1

## 2022-02-19 MED ORDER — DEXAMETHASONE SODIUM PHOSPHATE 10 MG/ML IJ SOLN
INTRAMUSCULAR | Status: AC
  Filled 2022-02-19: qty 1

## 2022-02-19 MED ORDER — TRANEXAMIC ACID-NACL 1000-0.7 MG/100ML-% IV SOLN
1000.0000 mg | INTRAVENOUS | Status: AC
  Administered 2022-02-19: 1000 mg via INTRAVENOUS
  Filled 2022-02-19: qty 100

## 2022-02-19 MED ORDER — DIPHENHYDRAMINE HCL 12.5 MG/5ML PO ELIX: 12.5000 mg | ORAL_SOLUTION | ORAL | Status: DC | PRN

## 2022-02-19 MED ORDER — ACETAMINOPHEN 500 MG PO TABS
1000.0000 mg | ORAL_TABLET | Freq: Four times a day (QID) | ORAL | Status: AC
  Administered 2022-02-19 – 2022-02-20 (×4): 1000 mg via ORAL
  Filled 2022-02-19 (×4): qty 2

## 2022-02-19 MED ORDER — ONDANSETRON HCL 4 MG/2ML IJ SOLN
INTRAMUSCULAR | Status: DC | PRN
  Administered 2022-02-19: 4 mg via INTRAVENOUS

## 2022-02-19 MED ORDER — SODIUM CHLORIDE (PF) 0.9 % IJ SOLN
INTRAMUSCULAR | Status: DC | PRN
  Administered 2022-02-19: 40 mL

## 2022-02-19 MED ORDER — HYDROMORPHONE HCL 1 MG/ML IJ SOLN
0.5000 mg | INTRAMUSCULAR | Status: DC | PRN
  Administered 2022-02-19 – 2022-02-20 (×3): 1 mg via INTRAVENOUS
  Filled 2022-02-19 (×3): qty 1

## 2022-02-19 MED ORDER — SODIUM CHLORIDE 0.9 % IR SOLN
Status: DC | PRN
  Administered 2022-02-19: 3000 mL

## 2022-02-19 MED ORDER — ONDANSETRON HCL 4 MG/2ML IJ SOLN
INTRAMUSCULAR | Status: AC
  Filled 2022-02-19: qty 2

## 2022-02-19 MED ORDER — CHLORHEXIDINE GLUCONATE 0.12 % MT SOLN
15.0000 mL | Freq: Once | OROMUCOSAL | Status: AC
  Administered 2022-02-19: 15 mL via OROMUCOSAL

## 2022-02-19 MED ORDER — ASPIRIN 81 MG PO CHEW
81.0000 mg | CHEWABLE_TABLET | Freq: Two times a day (BID) | ORAL | Status: DC
  Administered 2022-02-20: 81 mg via ORAL
  Filled 2022-02-19: qty 1

## 2022-02-19 MED ORDER — STERILE WATER FOR IRRIGATION IR SOLN
Status: DC | PRN
  Administered 2022-02-19: 2000 mL

## 2022-02-19 MED ORDER — ACETAMINOPHEN 325 MG PO TABS: 325.0000 mg | ORAL_TABLET | Freq: Four times a day (QID) | ORAL | Status: DC | PRN

## 2022-02-19 MED ORDER — DOCUSATE SODIUM 100 MG PO CAPS: 100.0000 mg | ORAL_CAPSULE | Freq: Two times a day (BID) | ORAL | 1 refills | Status: AC | PRN

## 2022-02-19 MED ORDER — CLONIDINE HCL (ANALGESIA) 100 MCG/ML EP SOLN
EPIDURAL | Status: DC | PRN
  Administered 2022-02-19: 50 ug

## 2022-02-19 MED ORDER — SODIUM CHLORIDE (PF) 0.9 % IJ SOLN
INTRAMUSCULAR | Status: AC
  Filled 2022-02-19: qty 40

## 2022-02-19 MED ORDER — MAGNESIUM CITRATE PO SOLN: 1.0000 | Freq: Once | ORAL | Status: DC | PRN

## 2022-02-19 MED ORDER — COLCHICINE 0.6 MG PO TABS: 0.6000 mg | ORAL_TABLET | Freq: Every day | ORAL | 0 refills | Status: AC

## 2022-02-19 MED ORDER — POLYETHYLENE GLYCOL 3350 17 G PO PACK: 17.0000 g | PACK | Freq: Every day | ORAL | Status: DC | PRN

## 2022-02-19 MED ORDER — AMLODIPINE BESYLATE 5 MG PO TABS: 5.0000 mg | ORAL_TABLET | Freq: Every evening | ORAL | Status: DC

## 2022-02-19 MED ORDER — AMISULPRIDE (ANTIEMETIC) 5 MG/2ML IV SOLN: 10.0000 mg | Freq: Once | INTRAVENOUS | Status: DC | PRN

## 2022-02-19 SURGICAL SUPPLY — 67 items
ATTUNE MED DOME PAT 41 KNEE (Knees) IMPLANT
ATTUNE PS FEM RT SZ 7 CEM KNEE (Femur) IMPLANT
ATTUNE PSRP INSR SZ7 6 KNEE (Insert) IMPLANT
BAG COUNTER SPONGE SURGICOUNT (BAG) IMPLANT
BAG DECANTER FOR FLEXI CONT (MISCELLANEOUS) ×1 IMPLANT
BAG ZIPLOCK 12X15 (MISCELLANEOUS) IMPLANT
BASE TIBIAL ROT PLAT SZ 8 KNEE (Knees) IMPLANT
BLADE SAW SGTL 11.0X1.19X90.0M (BLADE) ×1 IMPLANT
BLADE SAW SGTL 13.0X1.19X90.0M (BLADE) IMPLANT
BLADE SURG SZ10 CARB STEEL (BLADE) ×2 IMPLANT
BNDG ELASTIC 4X5.8 VLCR STR LF (GAUZE/BANDAGES/DRESSINGS) ×1 IMPLANT
BNDG ELASTIC 6X5.8 VLCR STR LF (GAUZE/BANDAGES/DRESSINGS) ×1 IMPLANT
BOWL SMART MIX CTS (DISPOSABLE) ×1 IMPLANT
CEMENT HV SMART SET (Cement) ×2 IMPLANT
COVER SURGICAL LIGHT HANDLE (MISCELLANEOUS) ×1 IMPLANT
CUFF TOURN SGL QUICK 34 (TOURNIQUET CUFF) ×1
CUFF TRNQT CYL 34X4.125X (TOURNIQUET CUFF) ×1 IMPLANT
DRAPE INCISE IOBAN 66X45 STRL (DRAPES) IMPLANT
DRAPE ORTHO SPLIT 77X108 STRL (DRAPES) ×4
DRAPE SHEET LG 3/4 BI-LAMINATE (DRAPES) ×1 IMPLANT
DRAPE SURG ORHT 6 SPLT 77X108 (DRAPES) ×2 IMPLANT
DRAPE U-SHAPE 47X51 STRL (DRAPES) ×1 IMPLANT
DRSG AQUACEL AG ADV 3.5X10 (GAUZE/BANDAGES/DRESSINGS) ×1 IMPLANT
DRSG TEGADERM 4X4.75 (GAUZE/BANDAGES/DRESSINGS) IMPLANT
DURAPREP 26ML APPLICATOR (WOUND CARE) ×1 IMPLANT
ELECT BLADE TIP CTD 4 INCH (ELECTRODE) IMPLANT
ELECT REM PT RETURN 15FT ADLT (MISCELLANEOUS) ×1 IMPLANT
EVACUATOR 1/8 PVC DRAIN (DRAIN) IMPLANT
GAUZE SPONGE 2X2 8PLY STRL LF (GAUZE/BANDAGES/DRESSINGS) IMPLANT
GLOVE BIO SURGEON STRL SZ7 (GLOVE) ×1 IMPLANT
GLOVE BIOGEL PI IND STRL 7.0 (GLOVE) ×1 IMPLANT
GLOVE BIOGEL PI IND STRL 8 (GLOVE) ×1 IMPLANT
GLOVE SURG SS PI 8.0 STRL IVOR (GLOVE) ×1 IMPLANT
GOWN STRL REUS W/ TWL XL LVL3 (GOWN DISPOSABLE) ×2 IMPLANT
GOWN STRL REUS W/TWL XL LVL3 (GOWN DISPOSABLE) ×4
HANDPIECE INTERPULSE COAX TIP (DISPOSABLE) ×1
HEMOSTAT SPONGE AVITENE ULTRA (HEMOSTASIS) IMPLANT
HOLDER FOLEY CATH W/STRAP (MISCELLANEOUS) IMPLANT
IMMOBILIZER KNEE 20 (SOFTGOODS) ×1
IMMOBILIZER KNEE 20 THIGH 36 (SOFTGOODS) ×1 IMPLANT
KIT TURNOVER KIT A (KITS) IMPLANT
MANIFOLD NEPTUNE II (INSTRUMENTS) ×1 IMPLANT
NS IRRIG 1000ML POUR BTL (IV SOLUTION) IMPLANT
PACK TOTAL KNEE CUSTOM (KITS) ×1 IMPLANT
PIN STEINMAN FIXATION KNEE (PIN) IMPLANT
PROTECTOR NERVE ULNAR (MISCELLANEOUS) ×1 IMPLANT
SAW OSC TIP CART 19.5X105X1.3 (SAW) ×1 IMPLANT
SEALER BIPOLAR AQUA 6.0 (INSTRUMENTS) IMPLANT
SET HNDPC FAN SPRY TIP SCT (DISPOSABLE) ×1 IMPLANT
SOLUTION PRONTOSAN WOUND 350ML (IRRIGATION / IRRIGATOR) ×1 IMPLANT
SPIKE FLUID TRANSFER (MISCELLANEOUS) ×1 IMPLANT
STAPLER VISISTAT (STAPLE) IMPLANT
STRIP CLOSURE SKIN 1/2X4 (GAUZE/BANDAGES/DRESSINGS) IMPLANT
SUT BONE WAX W31G (SUTURE) ×1 IMPLANT
SUT MNCRL AB 4-0 PS2 18 (SUTURE) IMPLANT
SUT STRATAFIX 0 PDS 27 VIOLET (SUTURE) ×1
SUT VIC AB 1 CT1 27 (SUTURE) ×6
SUT VIC AB 1 CT1 27XBRD ANTBC (SUTURE) ×3 IMPLANT
SUT VIC AB 2-0 CT1 27 (SUTURE) ×6
SUT VIC AB 2-0 CT1 TAPERPNT 27 (SUTURE) ×3 IMPLANT
SUTURE STRATFX 0 PDS 27 VIOLET (SUTURE) ×1 IMPLANT
SYR 3ML LL SCALE MARK (SYRINGE) IMPLANT
TIBIAL BASE ROT PLAT SZ 8 KNEE (Knees) ×1 IMPLANT
TRAY FOLEY MTR SLVR 16FR STAT (SET/KITS/TRAYS/PACK) ×1 IMPLANT
WATER STERILE IRR 1000ML POUR (IV SOLUTION) ×1 IMPLANT
WIPE CHG 2% 2PK PREOPERATIVE (MISCELLANEOUS) ×1 IMPLANT
WRAP KNEE MAXI GEL POST OP (GAUZE/BANDAGES/DRESSINGS) ×1 IMPLANT

## 2022-02-19 NOTE — Interval H&P Note (Signed)
History and Physical Interval Note:  02/19/2022 8:27 AM  Glenn Mcdonald  has presented today for surgery, with the diagnosis of Right knee degenerative joint disease.  The various methods of treatment have been discussed with the patient and family. After consideration of risks, benefits and other options for treatment, the patient has consented to  Procedure(s): TOTAL KNEE ARTHROPLASTY (Right) as a surgical intervention.  The patient's history has been reviewed, patient examined, no change in status, stable for surgery.  I have reviewed the patient's chart and labs.  Questions were answered to the patient's satisfaction.     [name]

## 2022-02-19 NOTE — Anesthesia Procedure Notes (Signed)
Procedure Name: MAC Date/Time: 02/19/2022 8:33 AM  Performed by: West Pugh, CRNAPre-anesthesia Checklist: Patient identified, Emergency Drugs available, Suction available, Patient being monitored and Timeout performed Patient Re-evaluated:Patient Re-evaluated prior to induction Oxygen Delivery Method: Simple face mask Preoxygenation: Pre-oxygenation with 100% oxygen Induction Type: IV induction Placement Confirmation: positive ETCO2 Dental Injury: Teeth and Oropharynx as per pre-operative assessment

## 2022-02-19 NOTE — Transfer of Care (Signed)
Immediate Anesthesia Transfer of Care Note  Patient: Glenn Mcdonald  Procedure(s) Performed: TOTAL KNEE ARTHROPLASTY (Right: Knee)  Patient Location: PACU  Anesthesia Type:Spinal and MAC combined with regional for post-op pain  Level of Consciousness: awake, alert , oriented, and patient cooperative  Airway & Oxygen Therapy: Patient Spontanous Breathing and Patient connected to nasal cannula oxygen  Post-op Assessment: Report given to RN and Post -op Vital signs reviewed and stable  Post vital signs: Reviewed and stable  Last Vitals:  Vitals Value Taken Time  BP 133/60 02/19/22 1119  Temp 36.4 C 02/19/22 1119  Pulse 51 02/19/22 1124  Resp 12 02/19/22 1124  SpO2 99 % 02/19/22 1124  Vitals shown include unvalidated device data.  Last Pain:  Vitals:   02/19/22 0700  TempSrc: Oral  PainSc:          Complications: No notable events documented.

## 2022-02-19 NOTE — Brief Op Note (Signed)
02/19/2022  8:28 AM  PATIENT:  Glenn Mcdonald  71 y.o. male  PRE-OPERATIVE DIAGNOSIS:  Right knee degenerative joint disease  POST-OPERATIVE DIAGNOSIS:  * No post-op diagnosis entered *  PROCEDURE:  Procedure(s): TOTAL KNEE ARTHROPLASTY (Right)  SURGEON:  Surgeon(s) and Role:    Jene Every, MD - Primary  PHYSICIAN ASSISTANT:   ASSISTANTS: Bissell   ANESTHESIA:   spinal  EBL:  50  BLOOD ADMINISTERED:none  DRAINS: none   LOCAL MEDICATIONS USED:  MARCAINE     SPECIMEN:  No Specimen  DISPOSITION OF SPECIMEN:  N/A  COUNTS:  YES  TOURNIQUET:  * Missing tourniquet times found for documented tourniquets in log: [id number] *  DICTATION: .Other Dictation: Dictation Number [id number]   PLAN OF CARE: Admit for overnight observation  PATIENT DISPOSITION:  PACU - hemodynamically stable.   Delay start of Pharmacological VTE agent (>24hrs) due to surgical blood loss or risk of bleeding: no

## 2022-02-19 NOTE — Anesthesia Postprocedure Evaluation (Signed)
Anesthesia Post Note  Patient: Glenn Mcdonald  Procedure(s) Performed: TOTAL KNEE ARTHROPLASTY (Right: Knee)     Patient location during evaluation: PACU Anesthesia Type: Spinal Level of consciousness: awake and alert Pain management: pain level controlled Vital Signs Assessment: post-procedure vital signs reviewed and stable Respiratory status: spontaneous breathing and respiratory function stable Cardiovascular status: blood pressure returned to baseline and stable Postop Assessment: spinal receding Anesthetic complications: no  No notable events documented.  Last Vitals:  Vitals:   02/19/22 1200 02/19/22 1300  BP: (!) 142/72 (!) 144/71  Pulse: (!) 50 (!) 55  Resp: 12 11  Temp:    SpO2: 100% 99%    Last Pain:  Vitals:   02/19/22 1300  TempSrc:   PainSc: 0-No pain                 Kennieth Rad

## 2022-02-19 NOTE — Evaluation (Signed)
Physical Therapy Evaluation Patient Details Name: Glenn Mcdonald MRN: [id number] DOB: 11-24-1950 Today's Date: 02/19/2022  History of Present Illness  Pt is a 71 year old male s/p R TKA on 02/19/22.  PMHx: L4-S1 microlumbar decompression, HTN, scoliosis  Clinical Impression  Pt is s/p TKA resulting in the deficits listed below (see PT Problem List).  Pt will benefit from skilled PT to increase their independence and safety with mobility to allow discharge to the venue listed below.  Pt assisted with ambulating and reports pain present but tolerable.  Pt plans to d/c home with spouse and return to lower level of their home (apt style) until pt is able to perform stairs.  Pt will need RW upon d/c.        Recommendations for follow up therapy are one component of a multi-disciplinary discharge planning process, led by the attending physician.  Recommendations may be updated based on patient status, additional functional criteria and insurance authorization.  Follow Up Recommendations Follow physician's recommendations for discharge plan and follow up therapies      Assistance Recommended at Discharge PRN  Patient can return home with the following  Help with stairs or ramp for entrance    Equipment Recommendations Rolling walker (2 wheels) (pt is 6'4")  Recommendations for Other Services       Functional Status Assessment Patient has had a recent decline in their functional status and demonstrates the ability to make significant improvements in function in a reasonable and predictable amount of time.     Precautions / Restrictions Precautions Precautions: Fall;Knee Required Braces or Orthoses: Knee Immobilizer - Right Restrictions Weight Bearing Restrictions: No Other Position/Activity Restrictions: WBAT      Mobility  Bed Mobility Overal bed mobility: Needs Assistance Bed Mobility: Supine to Sit, Sit to Supine     Supine to sit: Min guard Sit to supine: Min assist    General bed mobility comments: verbal cues for technique, assist for Rt LE onto bed    Transfers Overall transfer level: Needs assistance Equipment used: Rolling walker (2 wheels) Transfers: Sit to/from Stand Sit to Stand: Min assist, From elevated surface           General transfer comment: verbal cues for UE and LE positioning, assist to rise, steady and control descent    Ambulation/Gait Ambulation/Gait assistance: Min guard Gait Distance (Feet): 120 Feet Assistive device: Rolling walker (2 wheels) Gait Pattern/deviations: Step-to pattern, Decreased stance time - right, Antalgic Gait velocity: decr     General Gait Details: verbal cues for sequence, RW positioning, step length, posture  Stairs            Wheelchair Mobility    Modified Rankin (Stroke Patients Only)       Balance                                             Pertinent Vitals/Pain Pain Assessment Pain Assessment: 0-10 Pain Score: 7  Pain Location: left posterior knee Pain Descriptors / Indicators: Aching, Sore Pain Intervention(s): Repositioned, Monitored during session    Home Living Family/patient expects to be discharged to:: Private residence Living Arrangements: Spouse/significant other Available Help at Discharge: Family Type of Home: House Home Access: Level entry       Home Layout: Able to live on main level with bedroom/bathroom Home Equipment: None Additional Comments: plans to stay in their apt  in lower quarters, so pt doesn't have to perform stairs    Prior Function Prior Level of Function : Independent/Modified Independent                     Hand Dominance        Extremity/Trunk Assessment        Lower Extremity Assessment Lower Extremity Assessment: RLE deficits/detail RLE Deficits / Details: unable to perform SLR, able to perform ankle pumps, maintained KI       Communication   Communication: No difficulties  Cognition  Arousal/Alertness: Awake/alert Behavior During Therapy: WFL for tasks assessed/performed Overall Cognitive Status: Within Functional Limits for tasks assessed                                          General Comments      Exercises     Assessment/Plan    PT Assessment Patient needs continued PT services  PT Problem List Decreased strength;Decreased knowledge of use of DME;Decreased range of motion;Decreased mobility;Pain       PT Treatment Interventions Stair training;Gait training;DME instruction;Therapeutic exercise;Balance training;Functional mobility training;Therapeutic activities;Patient/family education    PT Goals (Current goals can be found in the Care Plan section)  Acute Rehab PT Goals PT Goal Formulation: With patient/family Time For Goal Achievement: 03/05/22 Potential to Achieve Goals: Good    Frequency 7X/week     Co-evaluation               AM-PAC PT "6 Clicks" Mobility  Outcome Measure Help needed turning from your back to your side while in a flat bed without using bedrails?: A Little Help needed moving from lying on your back to sitting on the side of a flat bed without using bedrails?: A Little Help needed moving to and from a bed to a chair (including a wheelchair)?: A Little Help needed standing up from a chair using your arms (e.g., wheelchair or bedside chair)?: A Lot Help needed to walk in hospital room?: A Lot Help needed climbing 3-5 steps with a railing? : A Lot 6 Click Score: 15    End of Session Equipment Utilized During Treatment: Gait belt;Right knee immobilizer Activity Tolerance: Patient tolerated treatment well Patient left: in bed;with call bell/phone within reach;with bed alarm set;with family/visitor present Nurse Communication: Mobility status PT Visit Diagnosis: Difficulty in walking, not elsewhere classified (R26.2)    Time: 9983-3825 PT Time Calculation (min) (ACUTE ONLY): 20 min   Charges:   PT  Evaluation $PT Eval Low Complexity: 1 Low         Kati PT, DPT Physical Therapist Acute Rehabilitation Services Preferred contact method: Secure Chat Weekend Pager Only: [phone] Office: [phone]   Janan Halter [place] 02/19/2022, 4:35 PM

## 2022-02-19 NOTE — Discharge Instructions (Signed)

## 2022-02-19 NOTE — Anesthesia Procedure Notes (Signed)
Anesthesia Regional Block: Adductor canal block   Pre-Anesthetic Checklist: , timeout performed,  Correct Patient, Correct Site, Correct Laterality,  Correct Procedure, Correct Position, site marked,  Risks and benefits discussed,  Surgical consent,  Pre-op evaluation,  At surgeon's request and post-op pain management  Laterality: Right  Prep: chloraprep       Needles:  Injection technique: Single-shot  Needle Type: Echogenic Needle     Needle Length: 9cm  Needle Gauge: 21     Additional Needles:   Procedures:,,,, ultrasound used (permanent image in chart),,    Narrative:  Start time: 02/19/2022 7:53 AM End time: 02/19/2022 7:58 AM Injection made incrementally with aspirations every 5 mL.  Performed by: Personally  Anesthesiologist: Marcene Duos, MD

## 2022-02-19 NOTE — Interval H&P Note (Signed)
History and Physical Interval Note:  02/19/2022 8:27 AM  Glenn Mcdonald  has presented today for surgery, with the diagnosis of Right knee degenerative joint disease.  The various methods of treatment have been discussed with the patient and family. After consideration of risks, benefits and other options for treatment, the patient has consented to  Procedure(s): TOTAL KNEE ARTHROPLASTY (Right) as a surgical intervention.  The patient's history has been reviewed, patient examined, no change in status, stable for surgery.  I have reviewed the patient's chart and labs.  Questions were answered to the patient's satisfaction.     [name]   

## 2022-02-19 NOTE — Anesthesia Preprocedure Evaluation (Signed)
Anesthesia Evaluation    Airway Mallampati: II  TM Distance: >3 FB Neck ROM: Full    Dental   Pulmonary asthma    breath sounds clear to auscultation       Cardiovascular hypertension, Pt. on medications  Rhythm:Regular Rate:Normal     Neuro/Psych negative neurological ROS     GI/Hepatic negative GI ROS, Neg liver ROS,,,  Endo/Other  negative endocrine ROS    Renal/GU negative Renal ROS     Musculoskeletal  (+) Arthritis ,    Abdominal   Peds  Hematology negative hematology ROS (+)   Anesthesia Other Findings   Reproductive/Obstetrics                              Lab Results  Component Value Date   WBC 7.0 02/11/2022   HGB 14.1 02/11/2022   HCT 42.4 02/11/2022   MCV 98.1 02/11/2022   PLT 200 02/11/2022   Lab Results  Component Value Date   CREATININE 0.88 02/11/2022   BUN 16 02/11/2022   NA 144 02/11/2022   K 4.3 02/11/2022   CL 108 02/11/2022   CO2 26 02/11/2022    Anesthesia Physical Anesthesia Plan  ASA: 2  Anesthesia Plan: Spinal and MAC   Post-op Pain Management: Regional block* and Ofirmev IV (intra-op)*   Induction:   PONV Risk Score and Plan: 1 and Propofol infusion, Ondansetron and Treatment may vary due to age or medical condition  Airway Management Planned: Natural Airway and Simple Face Mask  Additional Equipment:   Intra-op Plan:   Post-operative Plan:   Informed Consent: I have reviewed the patients History and Physical, chart, labs and discussed the procedure including the risks, benefits and alternatives for the proposed anesthesia with the patient or authorized representative who has indicated his/her understanding and acceptance.       Plan Discussed with: CRNA  Anesthesia Plan Comments:          Anesthesia Quick Evaluation

## 2022-02-19 NOTE — Anesthesia Procedure Notes (Addendum)
Spinal  Patient location during procedure: OR Start time: 02/19/2022 8:35 AM End time: 02/19/2022 8:42 AM Reason for block: surgical anesthesia Staffing Performed: anesthesiologist  Anesthesiologist: Marcene Duos, MD Performed by: Marcene Duos, MD Authorized by: Marcene Duos, MD   Preanesthetic Checklist Completed: patient identified, IV checked, site marked, risks and benefits discussed, surgical consent, monitors and equipment checked, pre-op evaluation and timeout performed Spinal Block Patient position: sitting Prep: DuraPrep Patient monitoring: heart rate, cardiac monitor, continuous pulse ox and blood pressure Approach: midline Location: L3-4 Injection technique: single-shot Needle Needle type: Sprotte  Needle gauge: 24 G Needle length: 9 cm Assessment Sensory level: T4 Events: CSF return

## 2022-02-19 NOTE — Op Note (Unsigned)
Glenn Mcdonald, Glenn Mcdonald MEDICAL RECORD NO: [id number] ACCOUNT NO: [id number] DATE OF BIRTH: 1950-11-29 FACILITY: [facility] LOCATION: [facility] PHYSICIAN: [name], MD  Operative Report   DATE OF PROCEDURE: 02/19/2022  PREOPERATIVE DIAGNOSIS:  End-stage osteoarthritis, varus deformity of the right knee.  POSTOPERATIVE DIAGNOSES:  End-stage osteoarthritis, varus deformity of the right knee.  PROCEDURE PERFORMED:  Right total knee arthroplasty utilizing DePuy Attune rotating platform 7 femur, 7 tibia, 6 mm insert, 38 patella.  ANESTHESIA:  Spinal.  ASSISTANT:  Andrez Grime, PA.  HISTORY:  A 71 year old with end-stage osteoarthrosis medial compartment, varus deformity of the knee end-stage patellofemoral indicated for replacement of the degenerated joint, failing conservative treatment, bone-on-bone arthrosis.  Risks and benefits  discussed including bleeding, infection, damage to neurovascular structures, no change in symptoms, worsening symptoms, DVT, PE, anesthetic complications, etc.  DESCRIPTION OF PROCEDURE:  With the patient in supine position, after induction of adequate spinal anesthesia, 3 grams Kefzol the right lower extremity was prepped and draped and exsanguinated in usual sterile fashion.  Thigh tourniquet inflated to 225  mmHg.  Midline incision was then made over the knee.  Full thickness flaps developed.  Median parapatellar arthrotomy was performed.  Elevated soft tissues medially, preserving the MCL.  Patella was gently everted, knee was flexed.  Tricompartmental  severe osteoarthritis was noted bone-on-bone medial compartment, patellofemoral joint.  Erosion of the patella.  A Leksell rongeur was utilized to remove osteophytes.  Remnants of the medial and lateral menisci.  This was used to fashion a notch above  the femoral notch as a starting hole for the femoral drill.  This was drilled entering the femoral canal in line with the femur.  This was then irrigated, used a  T-handle to identify the canal.  This was intact circumferentially.  Intramedullary guide  was placed with 9 off the distal femur, 5-degree.  This was pinned.  I performed a distal femoral cut, protecting soft tissues at all times.  Following this, I sized the femur off the anterior cortex to a 7.  This was then pinned in 3 degrees of external  rotation.  This was then pinned.  I performed a distal femoral cutting block, secured it performed our anterior, posterior and chamfer cuts with soft tissues protected at all times without notching the femur.  Next I subluxed the tibia.  Attention  turned to the tibia.  Remnants of medial and lateral menisci removed.  Geniculates were cauterized.  External alignment guide, 2 off the defect, which was medially.  Bisecting the tibiotalar joint parallel to the shaft, 3 degrees slope.  I then performed  a tibial cut, protecting soft tissues posteriorly at all times.  I then checked an extension block, which was satisfactory with a 6 in flexion and extension.  Following this, I reflexed the knee, everted the patella, measured the tibia to a 7,  maximizing the coverage, pinned it just the medial aspect of tibial tubercle, harvested bone centrally and impacted into the distal femur.  I drilled centrally, used our punch guide.  Turned attention back to the femur, used a box cut guide bisecting the  canal, pinned this, performed a box cut.  I placed a trial femur it fit flush.  I drilled our lug holes.  I placed a 5 insert, reduced it.  There was slight hyperextension, but good stability to varus valgus stressing at 0 and 30 degrees.  Everted, the  patella. It was measured to a 22.  We used a shim and the  external alignment guide.  This was then pinned.  I performed our patellar cut.  This was flushed with the eburnated bone.  I then measured to a 38.  I used a paddle parallel to the joint line.   Medializing our peg holes these were then drilled, I placed a trial patella,  reduced it had excellent patellofemoral tracking.  All components were then removed.  Checked posteriorly, capsule and popliteus was intact.  Copiously irrigated with pulsatile  lavage.  I then flexed the knee, everted the patella.  I injected Exparel through the medial capsule.  Approximately 15 mL.  This was after breaking the capsule, aspirating backwards without breaking the vacuum and then injecting without difficulty.  I  then placed Exparel in the proximal tibia.  Medially, we injected the quadriceps, adipose tissue.  Then, I irrigated with Prontosan.  Thoroughly dried all surfaces.  Mixed cement on back table in appropriate fashion under vacuum.  I then injected cement  into the tibia, digitally pressurizing it.  Cement was on the tibial tray and impacted into place.  Cement was on the femoral component and the femur was impacted into place.  Redundant cement removed, placed a 6 insert, reduced it, held in axial load  throughout the curing of the cement, I cemented and clamped the patella.  Prontosan and 0.25% lidocaine with epinephrine was placed in the joint during curing of the cement.  The joint was covered.  Following curing of the cement, tourniquet was deflated  at 65 minutes.  Any minimal bleeding was cauterized.  Good flexion, good extension, good stability to varus valgus stressing at 0-30 degrees, negative anterior drawer.  I then removed the trial 6 and meticulously removed all redundant cement.  Copiously  irrigated the joint with pulsatile lavage and then Prontosan and then inserted the 6 mm insert, reduced it and had excellent stability and good tracking of the patella and a negative anterior drawer.  Then in slight flexion I reapproximated patellar  arthrotomy with 1-0 Vicryl interrupted figure-of-eight sutures, subcutaneous with 2-0 and skin with staples.  Prior to this copiously irrigated through layers.  At final closure, we had excellent patellofemoral tracking and flexion to  gravity at 90  degrees.  Sterile dressing applied, placed in immobilizer and transported to the recovery room in satisfactory condition.  The patient tolerated the procedure well.  No complications.  ASSISTANT:  Andrez Grime, PA, was used throughout the case for patient positioning, exposure, closure.  BLOOD LOSS:  50 mL.   PUS D: 02/19/2022 11:12:17 am T: 02/19/2022 1:43:00 pm  JOB: [id number]/ [id number]

## 2022-02-20 ENCOUNTER — Encounter (HOSPITAL_COMMUNITY): Payer: Self-pay | Admitting: Specialist

## 2022-02-20 DIAGNOSIS — M1711 Unilateral primary osteoarthritis, right knee: Secondary | ICD-10-CM | POA: Diagnosis not present

## 2022-02-20 LAB — BASIC METABOLIC PANEL
Anion gap: 7 (ref 5–15)
BUN: 17 mg/dL (ref 8–23)
CO2: 28 mmol/L (ref 22–32)
Calcium: 8.9 mg/dL (ref 8.9–10.3)
Chloride: 104 mmol/L (ref 98–111)
Creatinine, Ser: 0.94 mg/dL (ref 0.61–1.24)
GFR, Estimated: >60 mL/min (ref >60)
Glucose, Bld: 159 mg/dL — ABNORMAL HIGH (ref 70–99)
Potassium: 4.8 mmol/L (ref 3.5–5.1)
Sodium: 139 mmol/L (ref 135–145)

## 2022-02-20 LAB — CBC
HCT: 38.7 % — ABNORMAL LOW (ref 39.0–52.0)
Hemoglobin: 12.7 g/dL — ABNORMAL LOW (ref 13.0–17.0)
MCH: 32.6 pg (ref 26.0–34.0)
MCHC: 32.8 g/dL (ref 30.0–36.0)
MCV: 99.5 fL (ref 80.0–100.0)
Platelets: 216 10*3/uL (ref 150–400)
RBC: 3.89 MIL/uL — ABNORMAL LOW (ref 4.22–5.81)
RDW: 11.4 % — ABNORMAL LOW (ref 11.5–15.5)
WBC: 11.6 10*3/uL — ABNORMAL HIGH (ref 4.0–10.5)
nRBC: 0 % (ref 0.0–0.2)

## 2022-02-20 NOTE — Progress Notes (Signed)
Subjective: 1 Day Post-Op Procedure(s) (LRB): TOTAL KNEE ARTHROPLASTY (Right) Patient reports pain as mild.  C/o knee pain. Tolerating meds well. No N/V. Voiding without difficulty. Ready to go home.  Objective: Vital signs in last 24 hours: Temp:  [98 F (36.7 C)-98.8 F (37.1 C)] 98.3 F (36.8 C) (11/16 1009) Pulse Rate:  [58-110] 110 (11/16 1009) Resp:  [14-17] 17 (11/16 1009) BP: (136-166)/(54-74) 152/54 (11/16 1009) SpO2:  [97 %-100 %] 99 % (11/16 1009)  Intake/Output from previous day: 11/15 0701 - 11/16 0700 In: 2681.7 [P.O.:600; I.V.:1781.7; IV Piggyback:300] Out: 2775 [Urine:2725; Blood:50] Intake/Output this shift: Total I/O In: 480 [P.O.:480] Out: 400 [Urine:400]  Recent Labs    02/20/22 0323  HGB 12.7*   Recent Labs    02/20/22 0323  WBC 11.6*  RBC 3.89*  HCT 38.7*  PLT 216   Recent Labs    02/20/22 0323  NA 139  K 4.8  CL 104  CO2 28  BUN 17  CREATININE 0.94  GLUCOSE 159*  CALCIUM 8.9   No results for input(s): "LABPT", "INR" in the last 72 hours.  Neurologically intact ABD soft Neurovascular intact Sensation intact distally Intact pulses distally Dorsiflexion/Plantar flexion intact Incision: dressing C/D/I and no drainage No cellulitis present Compartment soft No sign of DVT   Assessment/Plan: 1 Day Post-Op Procedure(s) (LRB): TOTAL KNEE ARTHROPLASTY (Right) Advance diet Up with therapy D/C IV fluids    Patient's anticipated LOS is less than 2 midnights, meeting these requirements: - Younger than 27 - Lives within 1 hour of care - Has a competent adult at home to recover with post-op recover - NO history of  - Chronic pain requiring opiods  - Diabetes  - Coronary Artery Disease  - Heart failure  - Heart attack  - Stroke  - DVT/VTE  - Cardiac arrhythmia  - Respiratory Failure/COPD  - Renal failure  - Anemia  - Advanced Liver disease     Dorothy Spark 02/20/2022, 1:26 PM

## 2022-02-20 NOTE — TOC Transition Note (Signed)
Transition of Care [facility]) - CM/SW Discharge Note   Patient Details  Name: Glenn Mcdonald MRN: [id number] Date of Birth: 1951-03-09  Transition of Care [facility]) CM/SW Contact:  Lennart Pall, LCSW Phone Number: 02/20/2022, 2:01 PM   Clinical Narrative:    Met with pt and confirming need for RW and bedside commode and no DME agency preference - order placed with [employer] for delivery to room.  Pt notes HHPT already arranged with [facility] [place]. Have confirmed with this agency and dc summary faxed per their request.  No further TOC needs.   Final next level of care: [place] Barriers to Discharge: No Barriers Identified   Patient Goals and CMS Choice Patient states their goals for this hospitalization and ongoing recovery are:: return home      Discharge Placement                       Discharge Plan and Services                DME Arranged: 3-N-1, Walker rolling DME Agency: [employer] Date DME Agency Contacted: 02/20/22 Time DME Agency Contacted: [phone] Representative spoke with at DME Agency: Ovid Curd HH Arranged: PT [place]: [place] Determinants of Health (SDOH) Interventions Food Insecurity Interventions: Intervention Not Indicated Housing Interventions: Intervention Not Indicated Transportation Interventions: Intervention Not Indicated Utilities Interventions: Intervention Not Indicated   Readmission Risk Interventions     No data to display

## 2022-02-20 NOTE — Discharge Summary (Signed)
Physician Discharge Summary   Patient ID: Glenn Mcdonald MRN: [id number] DOB/AGE: 09-05-1950 71 y.o.  Admit date: 02/19/2022 Discharge date: 02/20/22  Primary Diagnosis: right knee primary osteoarthritis  Admission Diagnoses:  Past Medical History:  Diagnosis Date   Arthritis    Asthma    out grew    History of kidney stones    Hypertension    Pneumonia    Discharge Diagnoses:   Principal Problem:   Right knee DJD  Estimated body mass index is 31.07 kg/m as calculated from the following:   Height as of this encounter: 6\' 5"  (1.956 m).   Weight as of this encounter: 118.8 kg.  Procedure:  Procedure(s) (LRB): TOTAL KNEE ARTHROPLASTY (Right)   Consults: None  HPI: see H&P Laboratory Data: Admission on 02/19/2022  Component Date Value Ref Range Status   Sodium 02/20/2022 139  135 - 145 mmol/L Final   Potassium 02/20/2022 4.8  3.5 - 5.1 mmol/L Final   Chloride 02/20/2022 104  98 - 111 mmol/L Final   CO2 02/20/2022 28  22 - 32 mmol/L Final   Glucose, Bld 02/20/2022 159 (H)  70 - 99 mg/dL Final   Glucose reference range applies only to samples taken after fasting for at least 8 hours.   BUN 02/20/2022 17  8 - 23 mg/dL Final   Creatinine, Ser 02/20/2022 0.94  0.61 - 1.24 mg/dL Final   Calcium 02/22/2022 8.9  8.9 - 10.3 mg/dL Final   GFR, Estimated 02/20/2022 >60  >60 mL/min Final   Comment: (NOTE) Calculated using the CKD-EPI Creatinine Equation (2021)    Anion gap 02/20/2022 7  5 - 15 Final   Performed at [facility], [address] [place], [place]   WBC 02/20/2022 11.6 (H)  4.0 - 10.5 K/uL Final   RBC 02/20/2022 3.89 (L)  4.22 - 5.81 MIL/uL Final   Hemoglobin 02/20/2022 12.7 (L)  13.0 - 17.0 g/dL Final   HCT 02/22/2022 38.7 (L)  39.0 - 52.0 % Final   MCV 02/20/2022 99.5  80.0 - 100.0 fL Final   MCH 02/20/2022 32.6  26.0 - 34.0 pg Final   MCHC 02/20/2022 32.8  30.0 - 36.0 g/dL Final   RDW 02/22/2022 11.4 (L)  11.5 - 15.5 % Final    Platelets 02/20/2022 216  150 - 400 K/uL Final   nRBC 02/20/2022 0.0  0.0 - 0.2 % Final   Performed at [facility], [address] [place], [place]  Hospital Outpatient Visit on 02/11/2022  Component Date Value Ref Range Status   MRSA, PCR 02/11/2022 NEGATIVE  NEGATIVE Final   Staphylococcus aureus 02/11/2022 POSITIVE (A)  NEGATIVE Final   Comment: (NOTE) The Xpert SA Assay (FDA approved for NASAL specimens in patients 63 years of age and older), is one component of a comprehensive surveillance program. It is not intended to diagnose infection nor to guide or monitor treatment. Performed at [facility], [address] [place], [place]    Sodium 02/11/2022 144  135 - 145 mmol/L Final   Potassium 02/11/2022 4.3  3.5 - 5.1 mmol/L Final   Chloride 02/11/2022 108  98 - 111 mmol/L Final   CO2 02/11/2022 26  22 - 32 mmol/L Final   Glucose, Bld 02/11/2022 92  70 - 99 mg/dL Final   Glucose reference range applies only to samples taken after fasting for at least 8 hours.   BUN 02/11/2022 16  8 - 23 mg/dL  Final   Creatinine, Ser 02/11/2022 0.88  0.61 - 1.24 mg/dL Final   Calcium 81/44/8185 9.3  8.9 - 10.3 mg/dL Final   GFR, Estimated 02/11/2022 >60  >60 mL/min Final   Comment: (NOTE) Calculated using the CKD-EPI Creatinine Equation (2021)    Anion gap 02/11/2022 10  5 - 15 Final   Performed at [facility], [address], [place], [place] [address]   WBC 02/11/2022 7.0  4.0 - 10.5 K/uL Final   RBC 02/11/2022 4.32  4.22 - 5.81 MIL/uL Final   Hemoglobin 02/11/2022 14.1  13.0 - 17.0 g/dL Final   HCT 70/26/3785 42.4  39.0 - 52.0 % Final   MCV 02/11/2022 98.1  80.0 - 100.0 fL Final   MCH 02/11/2022 32.6  26.0 - 34.0 pg Final   MCHC 02/11/2022 33.3  30.0 - 36.0 g/dL Final   RDW 88/50/2774 11.6  11.5 - 15.5 % Final   Platelets 02/11/2022 200  150 - 400 K/uL Final   nRBC 02/11/2022 0.0  0.0 - 0.2 % Final    Performed at [facility], [address], [place], [place] [address]     X-Rays:DG Knee 1-2 Views Right  Result Date: 02/19/2022 CLINICAL DATA:  Status post total right knee arthroplasty. Postoperative films. EXAM: RIGHT KNEE - 1-2 VIEW COMPARISON:  None Available. FINDINGS: Status post total knee arthroplasty. No perihardware lucency is seen to indicate hardware failure or loosening. Expected postoperative changes including intra-articular air and anterior and posterior subcutaneous air. Anterior surgical skin staples. No acute fracture or dislocation. IMPRESSION: Status post total knee arthroplasty without evidence of hardware failure. Electronically Signed   By: Neita Garnet M.D.   On: 02/19/2022 13:12    EKG: Orders placed or performed during the hospital encounter of 02/11/22   EKG 12 lead per protocol   EKG 12 lead per protocol     Hospital Course: Glenn Mcdonald is a 72 y.o. who was admitted to [facility]. They were brought to the operating room on 02/19/2022 and underwent Procedure(s): TOTAL KNEE ARTHROPLASTY.  Patient tolerated the procedure well and was later transferred to the recovery room and then to the orthopaedic floor for postoperative care.  They were given PO and IV analgesics for pain control following their surgery.  They were given 24 hours of postoperative antibiotics of  Anti-infectives (From admission, onward)    Start     Dose/Rate Route Frequency Ordered Stop   02/19/22 1500  ceFAZolin (ANCEF) IVPB 2g/100 mL premix        2 g 200 mL/hr over 30 Minutes Intravenous Every 6 hours 02/19/22 1116 02/20/22 0729   02/19/22 0645  ceFAZolin (ANCEF) IVPB 2g/100 mL premix        2 g 200 mL/hr over 30 Minutes Intravenous On call to O.R. 02/19/22 0630 02/19/22 0927      and started on DVT prophylaxis in the form of Aspirin, TED hose, and SCDs .   PT and OT were ordered for total joint protocol.  Discharge planning consulted to help with postop  disposition and equipment needs.  Patient had a good night on the evening of surgery.  They started to get up OOB with therapy on day one. Continued to work with therapy into day 1.  By day 1, the patient had progressed with therapy and meeting their goals.  Incision was healing well.  Patient was seen in rounds and was ready to go home.   Diet: Regular diet  Activity:WBAT Follow-up:in 10-14 days Disposition - Home with HHPT Discharged Condition: good   Discharge Instructions     Call MD / Call 911   Complete by: As directed    If you experience chest pain or shortness of breath, CALL 911 and be transported to the hospital emergency room.  If you develope a fever above 101 F, pus (white drainage) or increased drainage or redness at the wound, or calf pain, call your surgeon's office.   Constipation Prevention   Complete by: As directed    Drink plenty of fluids.  Prune juice may be helpful.  You may use a stool softener, such as Colace (over the counter) 100 mg twice a day.  Use MiraLax (over the counter) for constipation as needed.   Diet - low sodium heart healthy   Complete by: As directed    Increase activity slowly as tolerated   Complete by: As directed    Post-operative opioid taper instructions:   Complete by: As directed    POST-OPERATIVE OPIOID TAPER INSTRUCTIONS: It is important to wean off of your opioid medication as soon as possible. If you do not need pain medication after your surgery it is ok to stop day one. Opioids include: Codeine, Hydrocodone(Norco, Vicodin), Oxycodone(Percocet, oxycontin) and hydromorphone amongst others.  Long term and even short term use of opiods can cause: Increased pain response Dependence Constipation Depression Respiratory depression And more.  Withdrawal symptoms can include Flu like symptoms Nausea, vomiting And more Techniques to manage these symptoms Hydrate well Eat regular healthy meals Stay active Use relaxation  techniques(deep breathing, meditating, yoga) Do Not substitute Alcohol to help with tapering If you have been on opioids for less than two weeks and do not have pain than it is ok to stop all together.  Plan to wean off of opioids This plan should start within one week post op of your joint replacement. Maintain the same interval or time between taking each dose and first decrease the dose.  Cut the total daily intake of opioids by one tablet each day Next start to increase the time between doses. The last dose that should be eliminated is the evening dose.         Allergies as of 02/20/2022       Reactions   Codeine Nausea Only   Drunk/ out of it        Medication List     STOP taking these medications    ibuprofen 200 MG tablet Commonly known as: ADVIL       TAKE these medications    Advil PM 200-38 MG Tabs Generic drug: Ibuprofen-diphenhydrAMINE Cit Take 2 tablets by mouth at bedtime.   amLODipine 5 MG tablet Commonly known as: NORVASC Take 5 mg by mouth every evening.   aspirin EC 81 MG tablet Take 1 tablet (81 mg total) by mouth 2 (two) times daily after a meal. Day after surgery   colchicine 0.6 MG tablet Take 1 tablet (0.6 mg total) by mouth daily for 3 days.   docusate sodium 100 MG capsule Commonly known as: Colace Take 1 capsule (100 mg total) by mouth 2 (two) times daily as needed for mild constipation.   losartan 100 MG tablet Commonly known as: COZAAR Take 100 mg by mouth daily.   Oxycodone HCl 10 MG Tabs Take 1 tablet (10 mg total) by mouth every 4 (four) hours as needed for up to 7 days.   polyethylene glycol 17 g packet Commonly known as:  MIRALAX / GLYCOLAX Take 17 g by mouth daily.   rosuvastatin 20 MG tablet Commonly known as: CRESTOR Take 20 mg by mouth every evening.        Follow-up Information     Jene Every, MD Follow up in 2 week(s).   Specialty: Orthopedic Surgery Contact information: [address] [place]  [address] [place] [address] [phone]                 Signed: Andrez Grime, PA-C Orthopaedic Surgery 02/20/2022, 1:28 PM

## 2022-02-20 NOTE — Plan of Care (Signed)
Problem: Clinical Measurements: Goal: Postoperative complications will be avoided or minimized Outcome: Progressing   Problem: Pain Management: Goal: Pain level will decrease with appropriate interventions Outcome: Progressing   Problem: Education: Goal: Knowledge of General Education information will improve Description: Including pain rating scale, medication(s)/side effects and non-pharmacologic comfort measures Outcome: Progressing   Haydee Salter, RN 02/20/22 8:13 AM

## 2022-02-20 NOTE — Plan of Care (Signed)
Patient discharging home via private vehicle with wife. Haydee Salter, RN 02/20/22 1:52 PM

## 2022-02-20 NOTE — Progress Notes (Signed)
Physical Therapy Treatment Patient Details Name: Glenn Mcdonald MRN: [id number] DOB: 1951-02-09 Today's Date: 02/20/2022   History of Present Illness Pt is a 71 year old male s/p R TKA on 02/19/22.  PMHx: L4-S1 microlumbar decompression, HTN, scoliosis    PT Comments    Pt ambulated in hallway and performed LE exercises.  Pt provided with HEP handout.  Pt had no further questions and ready for d/c home from PT standpoint.    Recommendations for follow up therapy are one component of a multi-disciplinary discharge planning process, led by the attending physician.  Recommendations may be updated based on patient status, additional functional criteria and insurance authorization.  Follow Up Recommendations  Follow physician's recommendations for discharge plan and follow up therapies     Assistance Recommended at Discharge PRN  Patient can return home with the following Help with stairs or ramp for entrance   Equipment Recommendations  Rolling walker (2 wheels)    Recommendations for Other Services       Precautions / Restrictions Precautions Precautions: Fall;Knee Restrictions LLE Weight Bearing: Weight bearing as tolerated     Mobility  Bed Mobility Overal bed mobility: Needs Assistance Bed Mobility: Supine to Sit     Supine to sit: Supervision          Transfers Overall transfer level: Needs assistance Equipment used: Rolling walker (2 wheels) Transfers: Sit to/from Stand Sit to Stand: Min guard           General transfer comment: verbal cues for UE and LE positioning    Ambulation/Gait Ambulation/Gait assistance: Min guard Gait Distance (Feet): 150 Feet Assistive device: Rolling walker (2 wheels) Gait Pattern/deviations: Step-to pattern, Decreased stance time - right, Antalgic Gait velocity: decr     General Gait Details: verbal cues for sequence, RW positioning, step length, posture   Stairs             Wheelchair Mobility    Modified  Rankin (Stroke Patients Only)       Balance                                            Cognition Arousal/Alertness: Awake/alert Behavior During Therapy: WFL for tasks assessed/performed Overall Cognitive Status: Within Functional Limits for tasks assessed                                          Exercises Total Joint Exercises Ankle Circles/Pumps: AROM, Both, 10 reps Quad Sets: AROM, Both, 10 reps Heel Slides: AAROM, Right, 10 reps Hip ABduction/ADduction: AAROM, Right, 10 reps Straight Leg Raises: AAROM, Right, 10 reps    General Comments        Pertinent Vitals/Pain Pain Assessment Pain Assessment: 0-10 Pain Score: 5  Pain Location: left posterior knee Pain Descriptors / Indicators: Aching, Sore Pain Intervention(s): Repositioned, Monitored during session    Home Living                          Prior Function            PT Goals (current goals can now be found in the care plan section) Progress towards PT goals: Progressing toward goals    Frequency  PT Plan Current plan remains appropriate    Co-evaluation              AM-PAC PT "6 Clicks" Mobility   Outcome Measure  Help needed turning from your back to your side while in a flat bed without using bedrails?: A Little Help needed moving from lying on your back to sitting on the side of a flat bed without using bedrails?: A Little Help needed moving to and from a bed to a chair (including a wheelchair)?: A Little Help needed standing up from a chair using your arms (e.g., wheelchair or bedside chair)?: A Little Help needed to walk in hospital room?: A Little Help needed climbing 3-5 steps with a railing? : A Little 6 Click Score: 18    End of Session Equipment Utilized During Treatment: Gait belt Activity Tolerance: Patient tolerated treatment well Patient left: in chair;with call bell/phone within reach Nurse Communication: Mobility  status PT Visit Diagnosis: Difficulty in walking, not elsewhere classified (R26.2)     Time: [id number] PT Time Calculation (min) (ACUTE ONLY): 24 min  Charges:  $Gait Training: 8-22 mins $Therapeutic Exercise: 8-22 mins                    Jannette Spanner PT, DPT Physical Therapist Acute Rehabilitation Services Preferred contact method: Secure Chat Weekend Pager Only: [phone] Office: [place] 02/20/2022, 11:39 AM

## 2022-02-20 NOTE — Progress Notes (Signed)
Subjective: 1 Day Post-Op Procedure(s) (LRB): TOTAL KNEE ARTHROPLASTY (Right) Patient reports pain as 4 on 0-10 scale.   Denies CP or SOB.  Voiding without difficulty. Positive flatus. Objective: Vital signs in last 24 hours: Temp:  [97.5 F (36.4 C)-98.8 F (37.1 C)] 98.8 F (37.1 C) (11/16 0507) Pulse Rate:  [50-74] 69 (11/16 0507) Resp:  [11-18] 14 (11/16 0507) BP: (130-185)/(56-78) 136/56 (11/16 0507) SpO2:  [97 %-100 %] 97 % (11/16 0507)  Intake/Output from previous day: 11/15 0701 - 11/16 0700 In: 2681.7 [P.O.:600; I.V.:1781.7; IV Piggyback:300] Out: 2775 [Urine:2725; Blood:50] Intake/Output this shift: No intake/output data recorded.  Recent Labs    02/20/22 0323  HGB 12.7*   Recent Labs    02/20/22 0323  WBC 11.6*  RBC 3.89*  HCT 38.7*  PLT 216   Recent Labs    02/20/22 0323  NA 139  K 4.8  CL 104  CO2 28  BUN 17  CREATININE 0.94  GLUCOSE 159*  CALCIUM 8.9   No results for input(s): "LABPT", "INR" in the last 72 hours.  Neurologically intact ABD soft Neurovascular intact Sensation intact distally Intact pulses distally Dorsiflexion/Plantar flexion intact Compartment soft  Assessment/Plan:  1 Day Post-Op Procedure(s) (LRB): TOTAL KNEE ARTHROPLASTY (Right) Discharge home with home health if does well in PT   Principal Problem:   Right knee DJD      [name] 02/20/2022, @NOW 

## 2022-05-22 ENCOUNTER — Other Ambulatory Visit (HOSPITAL_COMMUNITY): Payer: Self-pay | Admitting: Specialist

## 2022-05-22 ENCOUNTER — Ambulatory Visit (HOSPITAL_COMMUNITY)
Admission: RE | Admit: 2022-05-22 | Discharge: 2022-05-22 | Disposition: A | Discharge status: Home / Self Care | Payer: Medicare Other | Source: Ambulatory Visit | Attending: Specialist | Admitting: Specialist

## 2022-05-22 DIAGNOSIS — M79604 Pain in right leg: Secondary | ICD-10-CM

## 2022-05-22 DIAGNOSIS — M7989 Other specified soft tissue disorders: Secondary | ICD-10-CM

## 2022-05-22 NOTE — Progress Notes (Signed)
Lower extremity venous right study completed.  Preliminary results relayed to Jaci Carrel for Tonita Cong, MD.   See CV Proc for preliminary results report.   Darlin Coco, RDMS, RVT
